# Patient Record
Sex: Female | Born: 1970 | Race: Black or African American | Hispanic: No | Marital: Married | Smoking: Never smoker
Health system: Southern US, Community
[De-identification: ages and names within clinical notes are randomized; demographics above are authoritative.]

## PROBLEM LIST (undated history)

## (undated) DIAGNOSIS — N920 Excessive and frequent menstruation with regular cycle: Secondary | ICD-10-CM

## (undated) DIAGNOSIS — R51 Headache: Secondary | ICD-10-CM

## (undated) DIAGNOSIS — R011 Cardiac murmur, unspecified: Secondary | ICD-10-CM

## (undated) DIAGNOSIS — K219 Gastro-esophageal reflux disease without esophagitis: Secondary | ICD-10-CM

## (undated) DIAGNOSIS — J302 Other seasonal allergic rhinitis: Secondary | ICD-10-CM

## (undated) DIAGNOSIS — Z8669 Personal history of other diseases of the nervous system and sense organs: Secondary | ICD-10-CM

## (undated) DIAGNOSIS — F419 Anxiety disorder, unspecified: Secondary | ICD-10-CM

## (undated) DIAGNOSIS — D649 Anemia, unspecified: Secondary | ICD-10-CM

## (undated) DIAGNOSIS — N840 Polyp of corpus uteri: Secondary | ICD-10-CM

## (undated) DIAGNOSIS — J40 Bronchitis, not specified as acute or chronic: Secondary | ICD-10-CM

## (undated) DIAGNOSIS — E559 Vitamin D deficiency, unspecified: Secondary | ICD-10-CM

## (undated) DIAGNOSIS — J45909 Unspecified asthma, uncomplicated: Secondary | ICD-10-CM

## (undated) DIAGNOSIS — J189 Pneumonia, unspecified organism: Secondary | ICD-10-CM

## (undated) DIAGNOSIS — Z973 Presence of spectacles and contact lenses: Secondary | ICD-10-CM

## (undated) DIAGNOSIS — N879 Dysplasia of cervix uteri, unspecified: Secondary | ICD-10-CM

## (undated) DIAGNOSIS — T4145XA Adverse effect of unspecified anesthetic, initial encounter: Secondary | ICD-10-CM

## (undated) DIAGNOSIS — E0500 Thyrotoxicosis with diffuse goiter without thyrotoxic crisis or storm: Secondary | ICD-10-CM

## (undated) DIAGNOSIS — E785 Hyperlipidemia, unspecified: Secondary | ICD-10-CM

## (undated) DIAGNOSIS — T8859XA Other complications of anesthesia, initial encounter: Secondary | ICD-10-CM

## (undated) DIAGNOSIS — T7840XA Allergy, unspecified, initial encounter: Secondary | ICD-10-CM

## (undated) DIAGNOSIS — I10 Essential (primary) hypertension: Secondary | ICD-10-CM

## (undated) HISTORY — PX: BREAST SURGERY: SHX581

## (undated) HISTORY — DX: Anxiety disorder, unspecified: F41.9

## (undated) HISTORY — DX: Personal history of other diseases of the nervous system and sense organs: Z86.69

## (undated) HISTORY — DX: Thyrotoxicosis with diffuse goiter without thyrotoxic crisis or storm: E05.00

## (undated) HISTORY — DX: Dysplasia of cervix uteri, unspecified: N87.9

## (undated) HISTORY — PX: WISDOM TOOTH EXTRACTION: SHX21

## (undated) HISTORY — DX: Essential (primary) hypertension: I10

## (undated) HISTORY — DX: Allergy, unspecified, initial encounter: T78.40XA

## (undated) HISTORY — DX: Gastro-esophageal reflux disease without esophagitis: K21.9

---

## 1997-10-19 ENCOUNTER — Other Ambulatory Visit: Admission: RE | Admit: 1997-10-19 | Discharge: 1997-10-19 | Payer: Self-pay | Admitting: Obstetrics & Gynecology

## 1997-12-14 ENCOUNTER — Ambulatory Visit (HOSPITAL_COMMUNITY): Admission: RE | Admit: 1997-12-14 | Discharge: 1997-12-14 | Payer: Self-pay | Admitting: Obstetrics & Gynecology

## 1998-04-28 ENCOUNTER — Other Ambulatory Visit: Admission: RE | Admit: 1998-04-28 | Discharge: 1998-04-28 | Payer: Self-pay | Admitting: Obstetrics & Gynecology

## 1999-03-04 ENCOUNTER — Ambulatory Visit (HOSPITAL_COMMUNITY): Admission: RE | Admit: 1999-03-04 | Discharge: 1999-03-04 | Payer: Self-pay | Admitting: Obstetrics & Gynecology

## 1999-03-04 ENCOUNTER — Encounter: Payer: Self-pay | Admitting: Obstetrics & Gynecology

## 1999-03-04 ENCOUNTER — Encounter (INDEPENDENT_AMBULATORY_CARE_PROVIDER_SITE_OTHER): Payer: Self-pay | Admitting: Specialist

## 1999-04-12 ENCOUNTER — Encounter (INDEPENDENT_AMBULATORY_CARE_PROVIDER_SITE_OTHER): Payer: Self-pay | Admitting: *Deleted

## 1999-04-12 ENCOUNTER — Ambulatory Visit (HOSPITAL_COMMUNITY): Admission: RE | Admit: 1999-04-12 | Discharge: 1999-04-12 | Payer: Self-pay | Admitting: Obstetrics & Gynecology

## 2000-02-29 ENCOUNTER — Encounter: Payer: Self-pay | Admitting: *Deleted

## 2000-02-29 ENCOUNTER — Observation Stay (HOSPITAL_COMMUNITY): Admission: AD | Admit: 2000-02-29 | Discharge: 2000-02-29 | Payer: Self-pay | Admitting: *Deleted

## 2000-09-17 ENCOUNTER — Inpatient Hospital Stay (HOSPITAL_COMMUNITY): Admission: AD | Admit: 2000-09-17 | Discharge: 2000-09-19 | Payer: Self-pay | Admitting: *Deleted

## 2000-12-25 ENCOUNTER — Emergency Department (HOSPITAL_COMMUNITY): Admission: EM | Admit: 2000-12-25 | Discharge: 2000-12-25 | Payer: Self-pay | Admitting: Emergency Medicine

## 2003-01-08 ENCOUNTER — Other Ambulatory Visit: Admission: RE | Admit: 2003-01-08 | Discharge: 2003-01-08 | Payer: Self-pay | Admitting: Gynecology

## 2003-03-03 ENCOUNTER — Other Ambulatory Visit: Admission: RE | Admit: 2003-03-03 | Discharge: 2003-03-03 | Payer: Self-pay | Admitting: Gynecology

## 2003-04-27 ENCOUNTER — Encounter: Admission: RE | Admit: 2003-04-27 | Discharge: 2003-04-27 | Payer: Self-pay | Admitting: Allergy and Immunology

## 2003-05-04 ENCOUNTER — Encounter: Admission: RE | Admit: 2003-05-04 | Discharge: 2003-08-02 | Payer: Self-pay | Admitting: Internal Medicine

## 2003-12-23 ENCOUNTER — Other Ambulatory Visit: Admission: RE | Admit: 2003-12-23 | Discharge: 2003-12-23 | Payer: Self-pay | Admitting: Gynecology

## 2004-02-01 HISTORY — PX: OTHER SURGICAL HISTORY: SHX169

## 2004-02-19 ENCOUNTER — Encounter (INDEPENDENT_AMBULATORY_CARE_PROVIDER_SITE_OTHER): Payer: Self-pay | Admitting: *Deleted

## 2004-02-19 ENCOUNTER — Ambulatory Visit (HOSPITAL_COMMUNITY): Admission: RE | Admit: 2004-02-19 | Discharge: 2004-02-19 | Payer: Self-pay | Admitting: Gynecology

## 2005-03-07 ENCOUNTER — Other Ambulatory Visit: Admission: RE | Admit: 2005-03-07 | Discharge: 2005-03-07 | Payer: Self-pay | Admitting: Gynecology

## 2005-11-12 ENCOUNTER — Inpatient Hospital Stay (HOSPITAL_COMMUNITY): Admission: AD | Admit: 2005-11-12 | Discharge: 2005-11-14 | Payer: Self-pay | Admitting: Gynecology

## 2005-12-25 ENCOUNTER — Other Ambulatory Visit: Admission: RE | Admit: 2005-12-25 | Discharge: 2005-12-25 | Payer: Self-pay | Admitting: Gynecology

## 2006-01-30 ENCOUNTER — Encounter: Admission: RE | Admit: 2006-01-30 | Discharge: 2006-03-01 | Payer: Self-pay | Admitting: Gynecology

## 2006-03-02 ENCOUNTER — Encounter: Admission: RE | Admit: 2006-03-02 | Discharge: 2006-04-01 | Payer: Self-pay | Admitting: Gynecology

## 2006-04-02 ENCOUNTER — Encounter: Admission: RE | Admit: 2006-04-02 | Discharge: 2006-05-02 | Payer: Self-pay | Admitting: Gynecology

## 2006-05-03 ENCOUNTER — Encounter: Admission: RE | Admit: 2006-05-03 | Discharge: 2006-06-01 | Payer: Self-pay | Admitting: Gynecology

## 2006-06-02 ENCOUNTER — Encounter: Admission: RE | Admit: 2006-06-02 | Discharge: 2006-07-02 | Payer: Self-pay | Admitting: Gynecology

## 2006-07-03 ENCOUNTER — Encounter: Admission: RE | Admit: 2006-07-03 | Discharge: 2006-07-07 | Payer: Self-pay | Admitting: Gynecology

## 2006-11-28 ENCOUNTER — Emergency Department (HOSPITAL_COMMUNITY): Admission: EM | Admit: 2006-11-28 | Discharge: 2006-11-29 | Payer: Self-pay | Admitting: Emergency Medicine

## 2007-02-04 ENCOUNTER — Other Ambulatory Visit: Admission: RE | Admit: 2007-02-04 | Discharge: 2007-02-04 | Payer: Self-pay | Admitting: Gynecology

## 2007-08-14 ENCOUNTER — Encounter: Admission: RE | Admit: 2007-08-14 | Discharge: 2007-08-14 | Payer: Self-pay | Admitting: Allergy and Immunology

## 2008-02-19 ENCOUNTER — Other Ambulatory Visit: Admission: RE | Admit: 2008-02-19 | Discharge: 2008-02-19 | Payer: Self-pay | Admitting: Gynecology

## 2008-07-07 ENCOUNTER — Encounter: Admission: RE | Admit: 2008-07-07 | Discharge: 2008-07-07 | Payer: Self-pay | Admitting: Allergy and Immunology

## 2008-07-08 ENCOUNTER — Emergency Department (HOSPITAL_COMMUNITY): Admission: EM | Admit: 2008-07-08 | Discharge: 2008-07-09 | Payer: Self-pay | Admitting: Emergency Medicine

## 2008-08-18 ENCOUNTER — Ambulatory Visit: Payer: Self-pay | Admitting: Gynecology

## 2008-11-08 ENCOUNTER — Emergency Department (HOSPITAL_COMMUNITY): Admission: EM | Admit: 2008-11-08 | Discharge: 2008-11-08 | Payer: Self-pay | Admitting: Emergency Medicine

## 2008-11-27 ENCOUNTER — Emergency Department (HOSPITAL_COMMUNITY): Admission: EM | Admit: 2008-11-27 | Discharge: 2008-11-27 | Payer: Self-pay | Admitting: Emergency Medicine

## 2009-02-05 ENCOUNTER — Emergency Department (HOSPITAL_BASED_OUTPATIENT_CLINIC_OR_DEPARTMENT_OTHER): Admission: EM | Admit: 2009-02-05 | Discharge: 2009-02-05 | Payer: Self-pay | Admitting: Emergency Medicine

## 2009-02-19 ENCOUNTER — Ambulatory Visit: Payer: Self-pay | Admitting: Gynecology

## 2009-02-19 ENCOUNTER — Other Ambulatory Visit: Admission: RE | Admit: 2009-02-19 | Discharge: 2009-02-19 | Payer: Self-pay | Admitting: Gynecology

## 2009-02-19 ENCOUNTER — Encounter: Payer: Self-pay | Admitting: Gynecology

## 2009-05-21 ENCOUNTER — Ambulatory Visit: Payer: Self-pay | Admitting: Gynecology

## 2009-06-14 ENCOUNTER — Encounter: Admission: RE | Admit: 2009-06-14 | Discharge: 2009-06-14 | Payer: Self-pay | Admitting: Family Medicine

## 2009-12-16 ENCOUNTER — Emergency Department (HOSPITAL_COMMUNITY): Admission: EM | Admit: 2009-12-16 | Discharge: 2009-12-16 | Payer: Self-pay | Admitting: Emergency Medicine

## 2010-02-24 ENCOUNTER — Other Ambulatory Visit: Admission: RE | Admit: 2010-02-24 | Discharge: 2010-02-24 | Payer: Self-pay | Admitting: Gynecology

## 2010-02-24 ENCOUNTER — Ambulatory Visit: Payer: Self-pay | Admitting: Gynecology

## 2010-07-24 ENCOUNTER — Encounter: Payer: Self-pay | Admitting: Psychiatry

## 2010-10-09 LAB — DIFFERENTIAL
Basophils Relative: 2 % — ABNORMAL HIGH (ref 0–1)
Eosinophils Absolute: 0 10*3/uL (ref 0.0–0.7)
Lymphs Abs: 1.1 10*3/uL (ref 0.7–4.0)
Monocytes Relative: 6 % (ref 3–12)
Neutro Abs: 4.8 10*3/uL (ref 1.7–7.7)
Neutrophils Relative %: 74 % (ref 43–77)

## 2010-10-09 LAB — URINALYSIS, ROUTINE W REFLEX MICROSCOPIC
Glucose, UA: NEGATIVE mg/dL
Ketones, ur: 40 mg/dL — AB
Leukocytes, UA: NEGATIVE
Nitrite: NEGATIVE
Protein, ur: NEGATIVE mg/dL

## 2010-10-09 LAB — CBC
HCT: 38.7 % (ref 36.0–46.0)
Hemoglobin: 13.1 g/dL (ref 12.0–15.0)
MCHC: 34 g/dL (ref 30.0–36.0)
RBC: 4.12 MIL/uL (ref 3.87–5.11)
RDW: 12.6 % (ref 11.5–15.5)

## 2010-10-09 LAB — COMPREHENSIVE METABOLIC PANEL
ALT: 12 U/L (ref 0–35)
Alkaline Phosphatase: 54 U/L (ref 39–117)
BUN: 8 mg/dL (ref 6–23)
CO2: 27 mEq/L (ref 19–32)
Calcium: 9.4 mg/dL (ref 8.4–10.5)
GFR calc non Af Amer: >60 mL/min (ref >60)
Glucose, Bld: 84 mg/dL (ref 70–99)
Total Protein: 8.3 g/dL (ref 6.0–8.3)

## 2010-10-09 LAB — POCT CARDIAC MARKERS
CKMB, poc: <1 ng/mL — ABNORMAL LOW (ref 1.0–8.0)
Myoglobin, poc: 47.9 ng/mL (ref 12–200)
Troponin i, poc: <0.05 ng/mL (ref 0.00–0.09)

## 2010-10-09 LAB — URINE MICROSCOPIC-ADD ON

## 2010-10-09 LAB — PREGNANCY, URINE: Preg Test, Ur: NEGATIVE

## 2010-10-17 LAB — POCT I-STAT, CHEM 8
Chloride: 101 mEq/L (ref 96–112)
HCT: 42 % (ref 36.0–46.0)
Hemoglobin: 14.3 g/dL (ref 12.0–15.0)
Potassium: 3.5 mEq/L (ref 3.5–5.1)
Sodium: 140 mEq/L (ref 135–145)

## 2010-10-17 LAB — URINALYSIS, ROUTINE W REFLEX MICROSCOPIC
Bilirubin Urine: NEGATIVE
Nitrite: NEGATIVE
Specific Gravity, Urine: 1.024 (ref 1.005–1.030)
Urobilinogen, UA: 0.2 mg/dL (ref 0.0–1.0)
pH: 6.5 (ref 5.0–8.0)

## 2010-11-18 NOTE — Op Note (Signed)
NAME:  Marilyn Gibson, Marilyn Gibson                         ACCOUNT NO.:  [id number]   MEDICAL RECORD NO.:  [id number]                   PATIENT TYPE:  AMB   LOCATION:  SDC                                  FACILITY:  [facility]   PHYSICIAN:  Timothy P. Fontaine, M.D.           DATE OF BIRTH:  [birth year]   DATE OF PROCEDURE:  02/19/2004  DATE OF DISCHARGE:                                 OPERATIVE REPORT   PREOPERATIVE DIAGNOSES:  Intrauterine fetal demise 19 weeks.   POSTOPERATIVE DIAGNOSES:  Intrauterine fetal demise 19 weeks.   PROCEDURE:  Dilatation and evacuation.   SURGEON:  Timothy P. Fontaine, M.D.   ANESTHESIA:  General.   ESTIMATED BLOOD LOSS:  50-100 mL.   SPECIMENS:  Fetus and placenta, specimen for chromosomal studies.   COMPLICATIONS:  None.   FINDINGS:  Sixteen week size uterus, fetus removed fragmented,  reconstruction complete.  No gross abnormalities noted.   DESCRIPTION OF PROCEDURE:  The patient was taken to the operating room,  underwent general anesthesia and was placed in the dorsal lithotomy  position, received a perineal vaginal preparation with Betadine solution.  The bladder emptied with in and out Foley catheterization and EUA was  performed. The patient was draped in the usual fashion. Cervix visualized  with the speculum, anterior lip grasped with a single tooth tenaculum and  the cervix was gently gradually dilated to admit the #16 suction curette.  The suction curettage was then performed with initial removal of placental  fragments and amniotic fluid. Through repetitive forceps passes using the  extraction forceps, the fetus and remaining placenta were removed and the  fetus was reconstructed to assure complete removal. A sharp gentle curettage  was then performed to assure complete emptying. A final suction pass to  evacuate the uterus from blood and clots, the tenaculum removed and adequate  hemostasis was visualized. The patient did receive 0.2 mg Methergine  IM at  the end of the procedure to maintain hemostasis.  The patient was placed in  supine position, awakened without difficulty, taken to the recovery room in  good condition having tolerated the procedure well.  The patient's blood  type is A positive.                                               Timothy P. Audie Box, M.D.    TPF/MEDQ  D:  02/19/2004  T:  02/20/2004  Job:  [id number]

## 2010-11-18 NOTE — Discharge Summary (Signed)
[facility]  Patient:    Marilyn Gibson, Marilyn Gibson                      MRN: [id number] Adm. Date:  [id number] Disc. Date: [id number] Attending:  Wetzel Bjornstad Dictator:   Antony Contras,  [facility]                           Discharge Summary  DISCHARGE DIAGNOSES:          Intrauterine pregnancy at 9 weeks with hyperemesis.  HISTORY OF PRESENT ILLNESS:   The patient is a 40 year old gravida 4, para 0, who is currently [redacted] weeks pregnant and presents to [facility] with a history of nausea and vomiting.  HOSPITAL COURSE/TREATMENT:    The patient was admitted to third floor at [facility] for IV fluids.  She also had an ultrasound to rule out a molar pregnancy.  She was hydrated and did respond to this measure and was able to be discharged on her first postadmission day in satisfactory condition.  DISPOSITION:                  The patient is to follow up for ultrasound and also for prenatal care. DD:  04/20/00 TD:  04/20/00 Job: [id number]

## 2010-11-18 NOTE — Discharge Summary (Signed)
[facility]  Patient:    Marilyn Gibson, Marilyn Gibson                      MRN: [id number] Adm. Date:  [id number] Disc. Date: [id number] Attending:  Wetzel Bjornstad Dictator:   Antony Contras, [facility]                           Discharge Summary  DISCHARGE DIAGNOSES:          1. Intrauterine pregnancy at 37-5/7 weeks.                               2. History of hyperthyroidism.  PROCEDURES:                   1. Normal spontaneous vaginal delivery of viable                                  infant over intact perineum.                               2. Repair of first-degree laceration.  HISTORY OF PRESENT ILLNESS:   The patient is a 40 year old gravida 4, para 0-0-3-0, with LMP of December 26, 1999, [facility] of October 01, 2000.  Pregnancy was complicated by hyperthyroidism.  PRENATAL LABORATORY DATA:     Blood type A positive, antibody screen negative. Sickle cell trait negative.  RPR, HBsAg, HIV nonreactive.  Rubella titer was equivocal.  GBS not on chart.  HOSPITAL COURSE:              The patient was admitted on September 17, 2000, at 37 weeks 5 days with regular contractions and was found to be 3-4 cm dilated, 100% effaced.  She did progress to complete dilatation.  Was delivered of an Apgars 9 and 4 female infant weighing 6 pounds 4 ounces over an intact perineum, with repair of first-degree laceration.  She also sustained a periurethral tear.  Postpartum course was normal.  She remained afebrile, had no difficulty voiding, and was able to be discharged on her second postpartum day in satisfactory condition.  CBC:  Hematocrit 28, hemoglobin 10.0, WBC 19.3, platelets 203.  FOLLOW-UP:                    In six weeks.  MEDICATIONS:                  Continue with prenatal vitamins and iron. Motrin for pain.  Rubella vaccine prior to discharge. DD:  10/08/00 TD:  10/09/00 Job: [id number] MV/[id number]

## 2010-11-18 NOTE — H&P (Signed)
NAME:  Marilyn Gibson, Marilyn Gibson                         ACCOUNT NO.:  [id number]   MEDICAL RECORD NO.:  [id number]                   PATIENT TYPE:  AMB   LOCATION:  SDC                                  [facility]ACILITY:  [facility]   PHYSICIAN:  Timothy P. Fontaine, M.D.           DATE OF BIRTH:  27-Aug-1970   DATE OF ADMISSION:  02/19/2004  DATE OF DISCHARGE:                                HISTORY & PHYSICAL   CHIEF COMPLAINT:  Missed AB.   HISTORY OF PRESENT ILLNESS:  A 40 year old G5 P25 female at 65 weeks by  history presents to the office for a routine OB with ultrasound screening  and was found to have a fetal demise with a measured fetus at approximately  16 weeks.  There is no cardiac activity and evidence of hydrops with scalp  edema, pericardial effusion, and ascites.  The patient is admitted at this  time for D&E.   PAST MEDICAL HISTORY:  Asthma, hypothyroidism.   PAST SURGICAL HISTORY:  Includes D&C x3, LEEP biopsy of the cervix.   ALLERGIES:  PENICILLIN.   CURRENT MEDICATIONS:  None.   REVIEW OF SYSTEMS:  Noncontributory.   SOCIAL HISTORY:  Noncontributory.   FAMILY HISTORY:  Noncontributory.   ADMISSION PHYSICAL EXAMINATION:  VITAL SIGNS:  Afebrile, vital signs are  stable.  HEENT:  Normal.  LUNGS:  Clear.  CARDIAC:  Regular rate without rubs, murmurs, or gallops.  ABDOMEN:  Benign.  PELVIC:  External, BUS, vagina normal.  Cervix is normal and closed.  Uterus  approximately 16 weeks size, midline, mobile, soft.  Adnexa without gross  masses or tenderness.   ASSESSMENT:  Fetal demise, 19 weeks by dates, 16 weeks by size; for  dilatation and evacuation.  Risks, benefits, indications, and alternatives  for the procedure were reviewed with the patient to include the options for  expectant management, induction, versus D&E.  The risks of D&E were reviewed  to include infection; hemorrhage necessitating transfusion and the risks of  transfusion; uterine perforation causing internal  organ damage including  bowel, bladder, ureters, vessels and nerves necessitating major exploratory  reparative surgeries and further  reparative surgeries including ostomy formation.  The patient had a number  of labs drawn through the office due to the fetal loss and these results are  pending.  Her questions are answered to her satisfaction and she is ready to  proceed with surgery.  Her blood type is A positive.                                               Timothy P. Audie Box, M.D.    TPF/MEDQ  D:  02/19/2004  T:  02/19/2004  Job:  [id number]

## 2011-03-24 ENCOUNTER — Encounter: Payer: Self-pay | Admitting: Gynecology

## 2011-04-14 ENCOUNTER — Ambulatory Visit (INDEPENDENT_AMBULATORY_CARE_PROVIDER_SITE_OTHER): Payer: 59 | Admitting: Gynecology

## 2011-04-14 ENCOUNTER — Other Ambulatory Visit (HOSPITAL_COMMUNITY)
Admission: RE | Admit: 2011-04-14 | Discharge: 2011-04-14 | Disposition: A | Discharge status: Home / Self Care | Payer: 59 | Source: Ambulatory Visit | Attending: Gynecology | Admitting: Gynecology

## 2011-04-14 ENCOUNTER — Encounter: Payer: Self-pay | Admitting: Gynecology

## 2011-04-14 VITALS — BP 120/80 | Ht 62.75 in | Wt 167.0 lb

## 2011-04-14 DIAGNOSIS — Z01419 Encounter for gynecological examination (general) (routine) without abnormal findings: Secondary | ICD-10-CM | POA: Insufficient documentation

## 2011-04-14 DIAGNOSIS — E559 Vitamin D deficiency, unspecified: Secondary | ICD-10-CM

## 2011-04-14 DIAGNOSIS — N926 Irregular menstruation, unspecified: Secondary | ICD-10-CM

## 2011-04-14 DIAGNOSIS — K219 Gastro-esophageal reflux disease without esophagitis: Secondary | ICD-10-CM | POA: Insufficient documentation

## 2011-04-14 NOTE — Progress Notes (Signed)
Marilyn Gibson August 03, 1970 [id number]        40 y.o.  for annual exam.  Overall doing well. Notes that her menses have changed a little bit over the last several months where she will start for a day or 2 and spot per day and and finish for a day or 2. No intermenstrual bleeding and they occur regularly each month.   Past medical history,surgical history, medications, allergies, family history and social history were all reviewed and documented in the EPIC chart. ROS:  Was performed and pertinent positives and negatives are included in the history.  Exam: chaperone present Filed Vitals:   04/14/11 1552  BP: 120/80   General appearance  Normal Skin grossly normal Head/Neck normal with no cervical or supraclavicular adenopathy thyroid normal Lungs  clear Cardiac RR, without RMG Abdominal  soft, nontender, without masses, organomegaly or hernia Breasts  examined lying and sitting without masses, retractions, discharge or axillary adenopathy. Pelvic  Ext/BUS/vagina  normal   Cervix  normal  Pap done  Uterus  anteverted, normal size, shape and contour, midline and mobile nontender   Adnexa  Without masses or tenderness    Anus and perineum  normal   Rectovaginal  normal sphincter tone without palpated masses or tenderness.    Assessment/Plan:  40 y.o. female for annual exam.    1. Mild menstrual irregularity. We'll check baseline TSH she does have a history of Graves' disease. Assuming normal then will keep menstrual calendar present. She's not having heavier menses, intermenstrual bleeding or even longer cycles to suggest structural changes such as polyps or myomas. Assuming her periods remain regular then we'll follow if they become prolonged or heavier or intermenstrual spotting than she'll call and we'll plan sonohysterogram. 2. Health maintenance. Self breast exams on a monthly basis discussed encouraged. She's never had mammogram and recommend she go ahead and schedule it now she agrees  to arrange this. No other blood work was done today as she reports having this done at her primary to include CBC lipid profile this year. Using spermicide for contraception as we have discussed on several occasions the failure risk with this and alternatives and she is comfortable continuing this excepting the failure risk.    Dara Lords MD, 4:51 PM 04/14/2011

## 2012-05-07 ENCOUNTER — Encounter: Payer: 59 | Admitting: Gynecology

## 2012-05-14 ENCOUNTER — Encounter: Payer: 59 | Admitting: Gynecology

## 2012-05-23 ENCOUNTER — Ambulatory Visit (INDEPENDENT_AMBULATORY_CARE_PROVIDER_SITE_OTHER): Payer: 59 | Admitting: Gynecology

## 2012-05-23 ENCOUNTER — Encounter: Payer: Self-pay | Admitting: Gynecology

## 2012-05-23 VITALS — BP 122/78 | Ht 63.0 in | Wt 184.0 lb

## 2012-05-23 DIAGNOSIS — Z01419 Encounter for gynecological examination (general) (routine) without abnormal findings: Secondary | ICD-10-CM

## 2012-05-23 DIAGNOSIS — Z8669 Personal history of other diseases of the nervous system and sense organs: Secondary | ICD-10-CM | POA: Insufficient documentation

## 2012-05-23 DIAGNOSIS — Z131 Encounter for screening for diabetes mellitus: Secondary | ICD-10-CM

## 2012-05-23 DIAGNOSIS — N879 Dysplasia of cervix uteri, unspecified: Secondary | ICD-10-CM | POA: Insufficient documentation

## 2012-05-23 DIAGNOSIS — I10 Essential (primary) hypertension: Secondary | ICD-10-CM | POA: Insufficient documentation

## 2012-05-23 DIAGNOSIS — N898 Other specified noninflammatory disorders of vagina: Secondary | ICD-10-CM

## 2012-05-23 DIAGNOSIS — Z309 Encounter for contraceptive management, unspecified: Secondary | ICD-10-CM

## 2012-05-23 DIAGNOSIS — E559 Vitamin D deficiency, unspecified: Secondary | ICD-10-CM

## 2012-05-23 DIAGNOSIS — N946 Dysmenorrhea, unspecified: Secondary | ICD-10-CM

## 2012-05-23 DIAGNOSIS — N899 Noninflammatory disorder of vagina, unspecified: Secondary | ICD-10-CM

## 2012-05-23 LAB — CBC WITH DIFFERENTIAL/PLATELET
Eosinophils Absolute: 0.2 10*3/uL (ref 0.0–0.7)
Eosinophils Relative: 3 % (ref 0–5)
Hemoglobin: 11.8 g/dL — ABNORMAL LOW (ref 12.0–15.0)
Lymphs Abs: 2.3 10*3/uL (ref 0.7–4.0)
MCH: 28.4 pg (ref 26.0–34.0)
MCHC: 33.9 g/dL (ref 30.0–36.0)
MCV: 83.9 fL (ref 78.0–100.0)
Monocytes Relative: 12 % (ref 3–12)
RBC: 4.15 MIL/uL (ref 3.87–5.11)

## 2012-05-23 LAB — WET PREP FOR TRICH, YEAST, CLUE: Trich, Wet Prep: NONE SEEN

## 2012-05-23 MED ORDER — FLUCONAZOLE 150 MG PO TABS: 150.0000 mg | ORAL_TABLET | Freq: Once | ORAL | Status: DC

## 2012-05-23 NOTE — Progress Notes (Signed)
Marilyn Gibson 05-15-1971 [id number]        41 y.o.  [id number] for annual exam.    Past medical history,surgical history, medications, allergies, family history and social history were all reviewed and documented in the EPIC chart. ROS:  Was performed and pertinent positives and negatives are included in the history.  Exam: Kim assistant Filed Vitals:   05/23/12 1507  BP: 122/78  Height: 5\' 3"  (1.6 m)  Weight: 184 lb (83.462 kg)   General appearance  Normal Skin grossly normal Head/Neck normal with no cervical or supraclavicular adenopathy thyroid normal Lungs  clear Cardiac RR, without RMG Abdominal  soft, nontender, without masses, organomegaly or hernia Breasts  examined lying and sitting without masses, retractions, discharge or axillary adenopathy. Pelvic  Ext/BUS/vagina  normal with white discharge  Cervix  normal   Uterus  anteverted, normal size, shape and contour, midline and mobile nontender   Adnexa  Without masses or tenderness    Anus and perineum  normal   Rectovaginal  normal sphincter tone without palpated masses or tenderness.    Assessment/Plan:  41 y.o. [id number] female for annual exam, regular menses, spermicide contraception..   1. Contraceptive counseling. Patient using spermicide to want to talk about tubal sterilization. Has issues with hormonal contraception with headaches to include a Mirena IUD having to be removed in birth control pills. Husband refuses vasectomy. I reviewed with the involved with tubal sterilization to include the most recent recommendations by SGO to consider salpingectomies for risk reduction surgery for serous carcinoma even in a low risk patient. She is complaining of some dysmenorrhea which seems to be worsening for the first several days of her menses over the past 6 months and I recommended ultrasound to assess for myomas/adenomyosis before scheduling surgery. If evidence of adenomyosis or myomas options for hysterectomy will be  discussed. 2. Pap smear. No Pap smear done today. Last Pap smear 04/2011. Does have history of dysplasia as a teenager but none since with normal Pap smears. Plan every 3 year screening. 3. Mammography. Patient's mammogram I strongly recommend she schedule this and she agrees to do so. SBE monthly reviewed. 4. Vaginal itching. Slight discharge historically. Wet prep/exam consistent with yeast. Treat with Diflucan 150x1 dose. Follow up if symptoms persist or recur. 5. Health maintenance. History of low vitamin D earlier this year at check by her primary. We'll recheck vitamin D now she's been on a supplement. Has had lipid profile at her primary. Asked for glucose screening US apparently she had a elevated glucose and we'll go ahead with a hemoglobin A1c. We'll also check baseline CBC. Patient will follow up for ultrasound and surgical discussion. Mammogram again encouraged.    Dara Lords MD, 4:09 PM 05/23/2012

## 2012-05-23 NOTE — Patient Instructions (Addendum)
Take diflucan pill for yeast Follow up for ultrasound as scheduled  Call to Schedule your mammogram  Facilities in [place]: 1)  [facility] of [place], [place] Phone: [phone] 2)  [facility], [phone] Phone: [phone] 3)  Dr. Yolanda Bonine at [facility] [address] Phone: [phone]     Mammogram A mammogram is an X-ray test to find changes in a woman's breast. You should get a mammogram if:  You are 64 years of age or older  You have risk factors.   Your doctor recommends that you have one.  BEFORE THE TEST  Do not schedule the test the week before your period, especially if your breasts are sore during this time.  On the day of your mammogram:  Wash your breasts and armpits well. After washing, do not put on any deodorant or talcum powder on until after your test.   Eat and drink as you usually do.   Take your medicines as usual.   If you are diabetic and take insulin, make sure you:   Eat before coming for your test.   Take your insulin as usual.   If you cannot keep your appointment, call before the appointment to cancel. Schedule another appointment.  TEST  You will need to undress from the waist up. You will put on a hospital gown.   Your breast will be put on the mammogram machine, and it will press firmly on your breast with a piece of plastic called a compression paddle. This will make your breast flatter so that the machine can X-ray all parts of your breast.   Both breasts will be X-rayed. Each breast will be X-rayed from above and from the side. An X-ray might need to be taken again if the picture is not good enough.   The mammogram will last about 15 to 30 minutes.  AFTER THE TEST Finding out the results of your test Ask when your test results will be ready. Make sure you get your test results.  Document Released: 09/15/2008 Document  Revised: 06/08/2011 Document Reviewed: 09/15/2008 [facility] Patient Information 2012 [place], [place].

## 2012-05-24 ENCOUNTER — Encounter: Payer: Self-pay | Admitting: Gynecology

## 2012-05-24 LAB — URINALYSIS W MICROSCOPIC + REFLEX CULTURE
Glucose, UA: NEGATIVE mg/dL
Hgb urine dipstick: NEGATIVE
Leukocytes, UA: NEGATIVE
Protein, ur: NEGATIVE mg/dL
Urobilinogen, UA: 0.2 mg/dL (ref 0.0–1.0)

## 2012-05-24 LAB — VITAMIN D 25 HYDROXY (VIT D DEFICIENCY, FRACTURES): Vit D, 25-Hydroxy: 35 ng/mL (ref 30–89)

## 2012-06-07 ENCOUNTER — Ambulatory Visit (INDEPENDENT_AMBULATORY_CARE_PROVIDER_SITE_OTHER): Payer: 59

## 2012-06-07 ENCOUNTER — Ambulatory Visit (INDEPENDENT_AMBULATORY_CARE_PROVIDER_SITE_OTHER): Payer: 59 | Admitting: Gynecology

## 2012-06-07 ENCOUNTER — Encounter: Payer: Self-pay | Admitting: Gynecology

## 2012-06-07 DIAGNOSIS — N946 Dysmenorrhea, unspecified: Secondary | ICD-10-CM

## 2012-06-07 NOTE — Patient Instructions (Signed)
Office will call you to arrange surgery. 

## 2012-06-07 NOTE — Progress Notes (Signed)
Patient presents for ultrasound due to dysmenorrhea. She is planning on tubal sterilization and we wanted to make sure there is no overt evidence of endometriosis or adenomyosis that might warrant a more aggressive approach such as hysterectomy.  Ultrasound is normal noting normal uterine size and echotexture. Endometrial echo 4.5 mm.  Right and left ovaries normal. No cul-de-sac fluid.  Assessment and plan: Patient wants to proceed with tubal sterilization. I again reviewed more conservative options she has issues with headaches when using hormonal contraception to include pills as well as progesterone only as well as her Mirena IUD. Nonhormonal IUD and vasectomy discussed and rejected. I reviewed was involved with laparoscopy and tubal sterilization. I again discussed the SGO statement about salpingectomies as a risk reductive surgery for peritoneal cancer and options for salpingectomy versus bending or clipping discussed. Patient agrees and wants to proceed with salpingectomies. Absolute irreversible sterility with bilateral salpingectomies discussed as well as the potential for failure and pregnancy following sterilization. The expected intraoperative postoperative courses to include general anesthesia, trocar placement, multiple port sites, use of electrocautery harmonic scalpel sharp blunt dissection reviewed. The risks of infection, prolonged antibiotics, hemorrhage necessitating transfusion and the risks of transfusion including transfusion reaction hepatitis HIV mad cow disease and other unknown entities were reviewed. Social complications requiring opening and draining of incisions, closure by secondary intention and long-term issues of cosmetics/keloid and hernia formation reviewed. The risks of inadvertent injury to internal organs either immediately recognized or delay recognized including bowel bladder ureters vessels and nerves necessitating major exploratory reparative surgeries and future  reparative surgeries including ostomy formation bowel resection bladder repair ureteral damage repair all discussed understood and accepted. The patient's questions were answered to her satisfaction and she is ready to proceed with surgery and we'll schedule this at her convenience.

## 2012-06-23 ENCOUNTER — Encounter (HOSPITAL_COMMUNITY): Payer: Self-pay | Admitting: Pharmacist

## 2012-07-02 ENCOUNTER — Encounter (HOSPITAL_COMMUNITY): Payer: Self-pay

## 2012-07-02 ENCOUNTER — Other Ambulatory Visit: Payer: Self-pay

## 2012-07-02 ENCOUNTER — Encounter (HOSPITAL_COMMUNITY)
Admission: RE | Admit: 2012-07-02 | Discharge: 2012-07-02 | Disposition: A | Discharge status: Home / Self Care | Payer: 59 | Source: Ambulatory Visit | Attending: Gynecology | Admitting: Gynecology

## 2012-07-02 DIAGNOSIS — Z01818 Encounter for other preprocedural examination: Secondary | ICD-10-CM | POA: Insufficient documentation

## 2012-07-02 DIAGNOSIS — Z01812 Encounter for preprocedural laboratory examination: Secondary | ICD-10-CM | POA: Insufficient documentation

## 2012-07-02 HISTORY — DX: Unspecified asthma, uncomplicated: J45.909

## 2012-07-02 HISTORY — DX: Headache: R51

## 2012-07-02 HISTORY — DX: Bronchitis, not specified as acute or chronic: J40

## 2012-07-02 HISTORY — DX: Other seasonal allergic rhinitis: J30.2

## 2012-07-02 HISTORY — DX: Anemia, unspecified: D64.9

## 2012-07-02 HISTORY — DX: Hyperlipidemia, unspecified: E78.5

## 2012-07-02 HISTORY — DX: Cardiac murmur, unspecified: R01.1

## 2012-07-02 LAB — BASIC METABOLIC PANEL
BUN: 9 mg/dL (ref 6–23)
Creatinine, Ser: 0.8 mg/dL (ref 0.50–1.10)
GFR calc Af Amer: >90 mL/min (ref >90)
GFR calc non Af Amer: >90 mL/min (ref >90)

## 2012-07-02 LAB — CBC
HCT: 37.9 % (ref 36.0–46.0)
Platelets: 383 10*3/uL (ref 150–400)

## 2012-07-02 NOTE — Patient Instructions (Addendum)
   Your procedure is scheduled on: Thursday, Jan 2  Enter through the [employer] of [facility] at: 6 am Pick up the phone at the desk and dial [phone] and inform us of your arrival.  Please call this number if you have any problems the morning of surgery: [phone]  Remember: Do not eat food after midnight: Wednesday Do not drink clear liquids after: Wednesday Take these medicines the morning of surgery with a SIP OF WATER:  Pt will bring Albuterol inhaler with her on day of surgery.  Will take dexilant and dulera if needed.  Do not wear jewelry, make-up, or FINGER nail polish No metal in your hair or on your body. Do not wear lotions, powders, perfumes. You may wear deodorant.  Please use your CHG wash as directed prior to surgery.  Do not shave anywhere for at least 12 hours prior to first CHG shower.  Do not bring valuables to the hospital. Contacts, dentures or bridgework may not be worn into surgery.   Patients discharged on the day of surgery will not be allowed to drive home.  Home with husband Devoiry Corriher.

## 2012-07-02 NOTE — H&P (Addendum)
  Marilyn Gibson [birth year] [id number]   History and Physical  Chief complaint: desires permanent sterilization  History of present illness: 41 y.o. [id number] who desires permanent sterilization after counseling for alternatives as noted in 06/07/2012 note.  Patient is admitted for laparoscopic tubal sterilization.  Past medical history,surgical history, medications, allergies, family history and social history were all reviewed and documented in the EPIC chart. ROS:  Was performed and pertinent positives and negatives are included in the history of present illness.  General: well developed, well nourished female, no acute distress HEENT: normal  Lungs: clear to auscultation without wheezing, rales or rhonchi  Cardiac: regular rate without rubs, murmurs or gallops  Abdomen: soft, nontender without masses, guarding, rebound, organomegaly  Pelvic: external bus vagina: normal   Cervix: grossly normal  Uterus: normal size, midline and mobile, nontender  Adnexa: without masses or tenderness      Assessment/Plan:  41 y.o. [id number] Patient wants to proceed with tubal sterilization. I again reviewed more conservative options she has issues with headaches when using hormonal contraception to include pills as well as progesterone only as well as her Mirena IUD. Nonhormonal IUD and vasectomy discussed and rejected. I reviewed what is involved with laparoscopy and tubal sterilization. I again discussed the SGO statement about salpingectomies as a risk reductive surgery for peritoneal cancer and options for salpingectomy versus banding or clipping discussed. Patient agrees and wants to proceed with salpingectomies. Absolute irreversible sterility with bilateral salpingectomies discussed as well as the potential for failure and pregnancy following sterilization. The expected intraoperative postoperative courses to include general anesthesia, trocar placement, multiple port sites, use of electrocautery harmonic  scalpel sharp blunt dissection reviewed. The risks of infection, prolonged antibiotics, hemorrhage necessitating transfusion and the risks of transfusion including transfusion reaction hepatitis HIV mad cow disease and other unknown entities were reviewed. Incisional complications requiring opening and draining of incisions, closure by secondary intention and long-term issues of cosmetics/keloid and hernia formation reviewed. The risks of inadvertent injury to internal organs either immediately recognized or delay recognized including bowel bladder ureters vessels and nerves necessitating major exploratory reparative surgeries and future reparative surgeries including ostomy formation, bowel resection, bladder repair, ureteral damage repair all discussed understood and accepted. The patient's questions were answered to her satisfaction and she is ready to proceed with surgery.       Dara Lords MD, 5:04 PM 07/02/2012

## 2012-07-03 HISTORY — PX: BREAST EXCISIONAL BIOPSY: SUR124

## 2012-07-03 HISTORY — PX: BREAST BIOPSY: SHX20

## 2012-07-03 MED ORDER — CLINDAMYCIN PHOSPHATE 900 MG/50ML IV SOLN
900.0000 mg | INTRAVENOUS | Status: AC
  Administered 2012-07-04: 900 mg via INTRAVENOUS
  Filled 2012-07-03: qty 50

## 2012-07-03 MED ORDER — CIPROFLOXACIN IN D5W 400 MG/200ML IV SOLN
400.0000 mg | INTRAVENOUS | Status: AC
  Administered 2012-07-04: 400 mg via INTRAVENOUS
  Filled 2012-07-03: qty 200

## 2012-07-04 ENCOUNTER — Encounter (HOSPITAL_COMMUNITY)
Admission: RE | Disposition: A | Discharge status: Home / Self Care | Payer: Self-pay | Source: Ambulatory Visit | Attending: Gynecology

## 2012-07-04 ENCOUNTER — Encounter (HOSPITAL_COMMUNITY): Payer: Self-pay | Admitting: *Deleted

## 2012-07-04 ENCOUNTER — Encounter (HOSPITAL_COMMUNITY): Payer: Self-pay | Admitting: Anesthesiology

## 2012-07-04 ENCOUNTER — Ambulatory Visit (HOSPITAL_COMMUNITY)
Admission: RE | Admit: 2012-07-04 | Discharge: 2012-07-04 | Disposition: A | Discharge status: Home / Self Care | Payer: PRIVATE HEALTH INSURANCE | Source: Ambulatory Visit | Attending: Gynecology | Admitting: Gynecology

## 2012-07-04 ENCOUNTER — Ambulatory Visit (HOSPITAL_COMMUNITY): Payer: PRIVATE HEALTH INSURANCE | Admitting: Anesthesiology

## 2012-07-04 DIAGNOSIS — Z302 Encounter for sterilization: Secondary | ICD-10-CM

## 2012-07-04 DIAGNOSIS — Z01812 Encounter for preprocedural laboratory examination: Secondary | ICD-10-CM | POA: Insufficient documentation

## 2012-07-04 DIAGNOSIS — Z01818 Encounter for other preprocedural examination: Secondary | ICD-10-CM | POA: Insufficient documentation

## 2012-07-04 HISTORY — PX: BILATERAL SALPINGECTOMY: SHX5743

## 2012-07-04 HISTORY — PX: LAPAROSCOPY: SHX197

## 2012-07-04 LAB — HCG, SERUM, QUALITATIVE: Preg, Serum: NEGATIVE

## 2012-07-04 SURGERY — LAPAROSCOPY OPERATIVE
Anesthesia: General | Site: Abdomen | Wound class: Clean Contaminated

## 2012-07-04 MED ORDER — DEXAMETHASONE SODIUM PHOSPHATE 10 MG/ML IJ SOLN
INTRAMUSCULAR | Status: AC
  Filled 2012-07-04: qty 1

## 2012-07-04 MED ORDER — FENTANYL CITRATE 0.05 MG/ML IJ SOLN
INTRAMUSCULAR | Status: DC | PRN
  Administered 2012-07-04: 100 ug via INTRAVENOUS
  Administered 2012-07-04: 50 ug via INTRAVENOUS
  Administered 2012-07-04: 100 ug via INTRAVENOUS

## 2012-07-04 MED ORDER — ROCURONIUM BROMIDE 50 MG/5ML IV SOLN
INTRAVENOUS | Status: AC
  Filled 2012-07-04: qty 1

## 2012-07-04 MED ORDER — HYDROCODONE-ACETAMINOPHEN 5-500 MG PO TABS: 1.0000 | ORAL_TABLET | Freq: Four times a day (QID) | ORAL | Status: DC | PRN

## 2012-07-04 MED ORDER — PROPOFOL 10 MG/ML IV EMUL
INTRAVENOUS | Status: AC
  Filled 2012-07-04: qty 20

## 2012-07-04 MED ORDER — KETOROLAC TROMETHAMINE 30 MG/ML IJ SOLN
15.0000 mg | Freq: Once | INTRAMUSCULAR | Status: AC | PRN
  Administered 2012-07-04: 30 mg via INTRAVENOUS

## 2012-07-04 MED ORDER — BUPIVACAINE HCL (PF) 0.25 % IJ SOLN
INTRAMUSCULAR | Status: DC | PRN
  Administered 2012-07-04: 5 mL

## 2012-07-04 MED ORDER — LIDOCAINE HCL (CARDIAC) 20 MG/ML IV SOLN
INTRAVENOUS | Status: AC
  Filled 2012-07-04: qty 5

## 2012-07-04 MED ORDER — MIDAZOLAM HCL 5 MG/5ML IJ SOLN
INTRAMUSCULAR | Status: DC | PRN
  Administered 2012-07-04: 2 mg via INTRAVENOUS

## 2012-07-04 MED ORDER — DEXAMETHASONE SODIUM PHOSPHATE 10 MG/ML IJ SOLN
INTRAMUSCULAR | Status: DC | PRN
  Administered 2012-07-04: 10 mg via INTRAVENOUS

## 2012-07-04 MED ORDER — PROPOFOL 10 MG/ML IV BOLUS
INTRAVENOUS | Status: DC | PRN
  Administered 2012-07-04: 200 mg via INTRAVENOUS
  Administered 2012-07-04: 100 mg via INTRAVENOUS

## 2012-07-04 MED ORDER — LIDOCAINE HCL (CARDIAC) 20 MG/ML IV SOLN
INTRAVENOUS | Status: DC | PRN
  Administered 2012-07-04: 50 mg via INTRAVENOUS

## 2012-07-04 MED ORDER — ROCURONIUM BROMIDE 100 MG/10ML IV SOLN
INTRAVENOUS | Status: DC | PRN
  Administered 2012-07-04: 30 mg via INTRAVENOUS

## 2012-07-04 MED ORDER — NEOSTIGMINE METHYLSULFATE 1 MG/ML IJ SOLN
INTRAMUSCULAR | Status: AC
  Filled 2012-07-04: qty 1

## 2012-07-04 MED ORDER — BUPIVACAINE HCL (PF) 0.25 % IJ SOLN
INTRAMUSCULAR | Status: AC
  Filled 2012-07-04: qty 30

## 2012-07-04 MED ORDER — DEXTROSE IN LACTATED RINGERS 5 % IV SOLN: INTRAVENOUS | Status: DC

## 2012-07-04 MED ORDER — LACTATED RINGERS IV SOLN
INTRAVENOUS | Status: DC
  Administered 2012-07-04 (×2): via INTRAVENOUS

## 2012-07-04 MED ORDER — MIDAZOLAM HCL 2 MG/2ML IJ SOLN
INTRAMUSCULAR | Status: AC
  Filled 2012-07-04: qty 2

## 2012-07-04 MED ORDER — KETOROLAC TROMETHAMINE 30 MG/ML IJ SOLN
INTRAMUSCULAR | Status: AC
  Filled 2012-07-04: qty 1

## 2012-07-04 MED ORDER — GLYCOPYRROLATE 0.2 MG/ML IJ SOLN
INTRAMUSCULAR | Status: AC
  Filled 2012-07-04: qty 2

## 2012-07-04 MED ORDER — ONDANSETRON HCL 4 MG/2ML IJ SOLN
INTRAMUSCULAR | Status: AC
  Filled 2012-07-04: qty 2

## 2012-07-04 MED ORDER — MEPERIDINE HCL 25 MG/ML IJ SOLN: 6.2500 mg | INTRAMUSCULAR | Status: DC | PRN

## 2012-07-04 MED ORDER — MIDAZOLAM HCL 2 MG/2ML IJ SOLN: 0.5000 mg | Freq: Once | INTRAMUSCULAR | Status: DC | PRN

## 2012-07-04 MED ORDER — FENTANYL CITRATE 0.05 MG/ML IJ SOLN
INTRAMUSCULAR | Status: AC
  Filled 2012-07-04: qty 5

## 2012-07-04 MED ORDER — GLYCOPYRROLATE 0.2 MG/ML IJ SOLN
INTRAMUSCULAR | Status: DC | PRN
  Administered 2012-07-04: 0.6 mg via INTRAVENOUS

## 2012-07-04 MED ORDER — ONDANSETRON HCL 4 MG/2ML IJ SOLN
INTRAMUSCULAR | Status: DC | PRN
  Administered 2012-07-04: 4 mg via INTRAVENOUS

## 2012-07-04 MED ORDER — PROMETHAZINE HCL 25 MG/ML IJ SOLN: 6.2500 mg | INTRAMUSCULAR | Status: DC | PRN

## 2012-07-04 MED ORDER — NEOSTIGMINE METHYLSULFATE 1 MG/ML IJ SOLN
INTRAMUSCULAR | Status: DC | PRN
  Administered 2012-07-04: 4 mg via INTRAVENOUS

## 2012-07-04 MED ORDER — KETOROLAC TROMETHAMINE 30 MG/ML IJ SOLN
INTRAMUSCULAR | Status: AC
  Administered 2012-07-04: 30 mg via INTRAVENOUS
  Filled 2012-07-04: qty 1

## 2012-07-04 MED ORDER — FENTANYL CITRATE 0.05 MG/ML IJ SOLN: 25.0000 ug | INTRAMUSCULAR | Status: DC | PRN

## 2012-07-04 SURGICAL SUPPLY — 20 items
ADH SKN CLS APL DERMABOND .7 (GAUZE/BANDAGES/DRESSINGS) ×2
BLADE SURG 15 STRL LF C SS BP (BLADE) ×2 IMPLANT
BLADE SURG 15 STRL SS (BLADE) ×3
CATH ROBINSON RED A/P 16FR (CATHETERS) ×3 IMPLANT
CLOTH BEACON ORANGE TIMEOUT ST (SAFETY) ×3 IMPLANT
DERMABOND ADVANCED (GAUZE/BANDAGES/DRESSINGS) ×1
DERMABOND ADVANCED .7 DNX12 (GAUZE/BANDAGES/DRESSINGS) IMPLANT
GLOVE BIO SURGEON STRL SZ7.5 (GLOVE) ×6 IMPLANT
GOWN STRL REIN XL XLG (GOWN DISPOSABLE) ×2 IMPLANT
NS IRRIG 1000ML POUR BTL (IV SOLUTION) ×3 IMPLANT
PACK LAPAROSCOPY BASIN (CUSTOM PROCEDURE TRAY) ×3 IMPLANT
PROTECTOR NERVE ULNAR (MISCELLANEOUS) ×3 IMPLANT
SCALPEL HARMONIC ACE (MISCELLANEOUS) ×1 IMPLANT
SUT PLAIN 4 0 FS 2 27 (SUTURE) ×3 IMPLANT
SUT VICRYL 0 UR6 27IN ABS (SUTURE) ×3 IMPLANT
TOWEL OR 17X24 6PK STRL BLUE (TOWEL DISPOSABLE) ×6 IMPLANT
TROCAR XCEL NON-BLD 11X100MML (ENDOMECHANICALS) ×3 IMPLANT
TROCAR XCEL NON-BLD 5MMX100MML (ENDOMECHANICALS) ×4 IMPLANT
WARMER LAPAROSCOPE (MISCELLANEOUS) ×3 IMPLANT
WATER STERILE IRR 1000ML POUR (IV SOLUTION) ×3 IMPLANT

## 2012-07-04 NOTE — Anesthesia Postprocedure Evaluation (Signed)
  Anesthesia Post-op Note  Patient: Marilyn Gibson  Procedure(s) Performed: Procedure(s) (LRB) with comments: LAPAROSCOPY OPERATIVE (N/A) BILATERAL SALPINGECTOMY (Bilateral)  Patient is awake and responsive. Pain and nausea are reasonably well controlled. Vital signs are stable and clinically acceptable. Oxygen saturation is clinically acceptable. There are no apparent anesthetic complications at this time. Patient is ready for discharge.

## 2012-07-04 NOTE — H&P (Signed)
  The patient was examined.  I reviewed the proposed surgery and consent form with the patient.  The dictated history and physical is current and accurate and all questions were answered. The patient is ready to proceed with surgery and has a realistic understanding and expectation for the outcome.   Dara Lords MD, 7:07 AM 07/04/2012

## 2012-07-04 NOTE — Anesthesia Preprocedure Evaluation (Addendum)
Anesthesia Evaluation  Patient identified by MRN, date of birth, ID band Patient awake    Reviewed: Allergy & Precautions, H&P , Patient's Chart, lab work & pertinent test results, reviewed documented beta blocker date and time   History of Anesthesia Complications Negative for: history of anesthetic complications  Airway Mallampati: III TM Distance: >3 FB Neck ROM: full    Dental No notable dental hx.    Pulmonary neg pulmonary ROS, asthma ,  breath sounds clear to auscultation  Pulmonary exam normal       Cardiovascular Exercise Tolerance: Good hypertension, negative cardio ROS  Rhythm:regular Rate:Normal     Neuro/Psych negative neurological ROS  negative psych ROS   GI/Hepatic negative GI ROS, Neg liver ROS, GERD-  Controlled,  Endo/Other  negative endocrine ROSHyperthyroidism   Renal/GU negative Renal ROS     Musculoskeletal   Abdominal   Peds  Hematology negative hematology ROS (+)   Anesthesia Other Findings GERD (gastroesophageal reflux disease)     History of migraines        Hypertension     Cervical dysplasia   as teenager, normal since    Bronchitis     Seasonal allergies        Asthma     Headache   tx w lexapro    Hyperlipidemia   no meds Heart murmur   as a child, no problem as adult    Grave's disease   dx w/ 1st child 2002 but then after delivery, graves dis went away Anemia           Reproductive/Obstetrics negative OB ROS                          Anesthesia Physical Anesthesia Plan  ASA: II  Anesthesia Plan: General ETT   Post-op Pain Management:    Induction:   Airway Management Planned:   Additional Equipment:   Intra-op Plan:   Post-operative Plan:   Informed Consent: I have reviewed the patients History and Physical, chart, labs and discussed the procedure including the risks, benefits and alternatives for the proposed anesthesia with the patient or  authorized representative who has indicated his/her understanding and acceptance.   Dental Advisory Given  Plan Discussed with: CRNA and Surgeon  Anesthesia Plan Comments:        Anesthesia Quick Evaluation

## 2012-07-04 NOTE — Transfer of Care (Signed)
Immediate Anesthesia Transfer of Care Note  Patient: Marilyn Gibson  Procedure(s) Performed: Procedure(s) (LRB) with comments: LAPAROSCOPY OPERATIVE (N/A) BILATERAL SALPINGECTOMY (Bilateral)  Patient Location: PACU  Anesthesia Type:General  Level of Consciousness: sedated  Airway & Oxygen Therapy: Patient Spontanous Breathing and Patient connected to nasal cannula oxygen  Post-op Assessment: Report given to PACU RN and Post -op Vital signs reviewed and stable  Post vital signs: stable  Complications: No apparent anesthesia complications

## 2012-07-04 NOTE — Op Note (Signed)
Marilyn Gibson [birth year] [id number]   Post Operative Note   Date of surgery:  07/04/2012  Pre Op Dx:  Desires permanent sterilization  Post Op Dx:  Desires permanent sterilization  Procedure:  Laparoscopic bilateral salpingectomies  Surgeon:  Dara Lords  Anesthesia:  General  EBL:  minimal  Complications:  None  Specimen:  Right and left fallopian tubes to pathology  Findings: EUA:  External BUS vagina normal. Cervix normal. Uterus normal size midline mobile. Adnexa without masses   Operative:  Anterior cul-de-sac normal. The posterior cul-de-sac normal. Uterus normal size shape and contour. Right and left fallopian tubes normal length caliber and fimbriated ends. Right and left ovaries grossly normal, free and mobile. No evidence of pelvic endometriosis or adhesive disease. Upper abdominal exam shows appendix grossly normal free and mobile. Liver/gallbladder not visualized due to omentum covering.  Procedure:  The patient was taken to the operating room, underwent general anesthesia, was placed in the low dorsal lithotomy position, received abdominal/perineal/vaginal preparation with Betadine scrub and solution, an in and out Foley catheterization performed and an EUA was performed.  A time out was performed by the surgical team. The cervix was visualized with a speculum, grasped with a single-tooth tenaculum and a Hulka tenaculum was placed without difficulty and the patient was draped in the usual fashion. A transverse infraumbilical incision was made and using the Optiview 10 mm direct entry trocar the abdomen was directly entered without difficulty under direct visualization and subsequently insufflated. Right and left 5 mm suprapubic ports were then placed under direct visualization after transillumination of the vessels without difficulty and examination of the pelvic organs and upper abdominal exam was carried out with findings noted above.  The right fallopian tube was  identified elevated and using the harmonic scalpel was freed from its attachments to the mesosalpinx and transected at its insertion to the uterine cornua without difficulty. Care was taken to avoid the infundibulopelvic pedicle and uterine ovarian pedicle as well as the ureter was identified away from the surgical site. A similar procedure was carried out on the other side. Both specimens were placed in the anterior cul-de-sac and subsequently the operative laparoscope was placed through the infraumbilical port and the specimens were retrieved and sent to pathology. The abdominal pressure was dropped and reinspection of the broad ligament showed adequate hemostasis bilaterally. The 5 mm ports were then removed under direct visualization showing adequate hemostasis and the infraumbilical port was back out under direct visualization showing adequate hemostasis and no evidence of hernia formation. All skin incisions were injected using 0.25% Marcaine and the infraumbilical incision was closed using a 0 Vicryl suture in an interrupted subcutaneous fascial stitch. The infraumbilical and right suprapubic skin incisions were closed using Dermabond skin adhesive and due to oozing in the left suprapubic port site and interrupted 4-0 plain suture was placed to reapproximate the skin and achieve hemostasis.  The Hulka tenaculum was removed, the patient received intraoperative Toradol, was placed in the supine position and awakened without difficulty and taken to the recovery room having tolerated the procedure well.    Dara Lords MD, 8:40 AM 07/04/2012

## 2012-07-05 ENCOUNTER — Encounter (HOSPITAL_COMMUNITY): Payer: Self-pay | Admitting: Gynecology

## 2012-07-09 ENCOUNTER — Telehealth: Payer: Self-pay

## 2012-07-09 NOTE — Telephone Encounter (Signed)
On 07/04/12 patient had laparoscopic salpingectomy with Dr. Velvet Bathe.  She said that he put some "glue" on her incisions.  She said some of it has come off in the shower but some of it is still hanging on and is starting to itch and irritate her.  She wants to know if okay to remove it.  Otherwise she said her incisions are fine.

## 2012-07-09 NOTE — Telephone Encounter (Signed)
Patient called today to let me know that her insurance changed Jan 1  And she needed to give me new info so we can file her surgery to correct insurance co.  I looked in system and she had provided hospital with a paper that had the info on it at her pre-op visit.  They scanned the paper in media manager and updated the ins in the system.  Patient is also concerned because her card says on the back that precertification is required for hospital confinement and for outpatient surgery.  I called [phone]) and spoke with [place] who checked the code and said no precertification was required. Patient was informed.

## 2012-07-09 NOTE — Telephone Encounter (Signed)
She can remove it in the shower to make it easier for her or with small fine scissors cut it off close to the skin without having to peel off what still may be stuck.

## 2012-07-10 NOTE — Telephone Encounter (Signed)
Patient informed. 

## 2012-07-18 ENCOUNTER — Ambulatory Visit (INDEPENDENT_AMBULATORY_CARE_PROVIDER_SITE_OTHER): Payer: PRIVATE HEALTH INSURANCE | Admitting: Gynecology

## 2012-07-18 ENCOUNTER — Encounter: Payer: Self-pay | Admitting: Gynecology

## 2012-07-18 DIAGNOSIS — Z09 Encounter for follow-up examination after completed treatment for conditions other than malignant neoplasm: Secondary | ICD-10-CM

## 2012-07-18 NOTE — Patient Instructions (Signed)
Follow up in November for your annual exam Follow up for your mammogram as scheduled

## 2012-07-18 NOTE — Progress Notes (Signed)
Patient presents for her postoperative visit from her laparoscopic bilateral salpingectomies for tubal sterilization. She's done well without complaints.  Examination with Kim assistant Abdomen soft nontender without masses guarding rebound organomegaly. Incisions healed nicely. 1 chromic suture left lower port remaining and removed.  Pelvic external BUS vagina normal. Bimanual without masses or tenderness.  Assessment and plan: Normal postop check status post laparoscopic salpingectomy for tubal sterilization. Doing well. Reviewed findings of surgery with her to include pictures and pathology showing benign fallopian tubes. Patient will follow up in November when she is due for her annual, sooner as needed. She does have a mammogram scheduled and is following up for this.

## 2012-12-30 ENCOUNTER — Other Ambulatory Visit: Payer: Self-pay | Admitting: Gynecology

## 2012-12-30 DIAGNOSIS — Z1231 Encounter for screening mammogram for malignant neoplasm of breast: Secondary | ICD-10-CM

## 2013-01-10 ENCOUNTER — Ambulatory Visit (HOSPITAL_COMMUNITY): Admission: RE | Admit: 2013-01-10 | Payer: PRIVATE HEALTH INSURANCE | Source: Ambulatory Visit

## 2013-01-15 ENCOUNTER — Ambulatory Visit (HOSPITAL_COMMUNITY)
Admission: RE | Admit: 2013-01-15 | Discharge: 2013-01-15 | Disposition: A | Discharge status: Home / Self Care | Payer: PRIVATE HEALTH INSURANCE | Source: Ambulatory Visit | Attending: Gynecology | Admitting: Gynecology

## 2013-01-15 DIAGNOSIS — Z1231 Encounter for screening mammogram for malignant neoplasm of breast: Secondary | ICD-10-CM | POA: Insufficient documentation

## 2013-01-17 ENCOUNTER — Other Ambulatory Visit: Payer: Self-pay | Admitting: Gynecology

## 2013-01-17 DIAGNOSIS — R928 Other abnormal and inconclusive findings on diagnostic imaging of breast: Secondary | ICD-10-CM

## 2013-01-24 ENCOUNTER — Other Ambulatory Visit: Payer: Self-pay | Admitting: Gynecology

## 2013-01-24 ENCOUNTER — Ambulatory Visit
Admission: RE | Admit: 2013-01-24 | Discharge: 2013-01-24 | Disposition: A | Discharge status: Home / Self Care | Payer: PRIVATE HEALTH INSURANCE | Source: Ambulatory Visit | Attending: Gynecology | Admitting: Gynecology

## 2013-01-24 DIAGNOSIS — R928 Other abnormal and inconclusive findings on diagnostic imaging of breast: Secondary | ICD-10-CM

## 2013-01-24 DIAGNOSIS — R921 Mammographic calcification found on diagnostic imaging of breast: Secondary | ICD-10-CM

## 2013-02-12 ENCOUNTER — Ambulatory Visit
Admission: RE | Admit: 2013-02-12 | Discharge: 2013-02-12 | Disposition: A | Discharge status: Home / Self Care | Payer: PRIVATE HEALTH INSURANCE | Source: Ambulatory Visit | Attending: Gynecology | Admitting: Gynecology

## 2013-02-12 ENCOUNTER — Other Ambulatory Visit (HOSPITAL_COMMUNITY): Payer: Self-pay | Admitting: Diagnostic Radiology

## 2013-02-12 DIAGNOSIS — R921 Mammographic calcification found on diagnostic imaging of breast: Secondary | ICD-10-CM

## 2013-02-12 DIAGNOSIS — R928 Other abnormal and inconclusive findings on diagnostic imaging of breast: Secondary | ICD-10-CM

## 2013-05-27 ENCOUNTER — Encounter: Payer: Self-pay | Admitting: Gynecology

## 2013-05-27 ENCOUNTER — Ambulatory Visit (INDEPENDENT_AMBULATORY_CARE_PROVIDER_SITE_OTHER): Payer: No Typology Code available for payment source | Admitting: Gynecology

## 2013-05-27 VITALS — BP 124/78 | Ht 64.0 in | Wt 178.0 lb

## 2013-05-27 DIAGNOSIS — Z01419 Encounter for gynecological examination (general) (routine) without abnormal findings: Secondary | ICD-10-CM

## 2013-05-27 LAB — CBC WITH DIFFERENTIAL/PLATELET
Eosinophils Relative: 3 % (ref 0–5)
HCT: 35.8 % — ABNORMAL LOW (ref 36.0–46.0)
Hemoglobin: 11.9 g/dL — ABNORMAL LOW (ref 12.0–15.0)
Lymphocytes Relative: 38 % (ref 12–46)
MCHC: 33.2 g/dL (ref 30.0–36.0)
MCV: 82.7 fL (ref 78.0–100.0)
Monocytes Absolute: 0.7 10*3/uL (ref 0.1–1.0)
Monocytes Relative: 12 % (ref 3–12)
Neutro Abs: 2.5 10*3/uL (ref 1.7–7.7)
RDW: 15.7 % — ABNORMAL HIGH (ref 11.5–15.5)
WBC: 5.4 10*3/uL (ref 4.0–10.5)

## 2013-05-27 NOTE — Progress Notes (Signed)
Marilyn Gibson [birth year] [id number]        42 y.o.  [id number] for annual exam.  Doing well.  Past medical history,surgical history, problem list, medications, allergies, family history and social history were all reviewed and documented in the EPIC chart.  ROS:  Performed and pertinent positives and negatives are included in the history, assessment and plan .  Exam: Kim assistant Filed Vitals:   05/27/13 1603  BP: 124/78  Height: 5\' 4"  (1.626 m)  Weight: 178 lb (80.74 kg)   General appearance  Normal Skin grossly normal Head/Neck normal with no cervical or supraclavicular adenopathy thyroid normal Lungs  clear Cardiac RR, without RMG Abdominal  soft, nontender, without masses, organomegaly or hernia Breasts  examined lying and sitting without masses, retractions, discharge or axillary adenopathy. Pelvic  Ext/BUS/vagina  Normal   Cervix  Normal   Uterus  anteverted, normal size, shape and contour, midline and mobile nontender   Adnexa  Without masses or tenderness    Anus and perineum  Normal   Rectovaginal  Normal sphincter tone without palpated masses or tenderness.    Assessment/Plan:  42 y.o. [id number] female for annual exam, regular menses, bilateral salpingectomy tubal sterilization.   1. Regular menses, tubal sterilization with bilateral salpingectomy. Doing well without complaints. 2. Pap smear 2012. No Pap smear done today. History of cervical dysplasia as a teenager with normal Pap smears since then. Plan repeat Pap smear next year at 3 year interval. 3. Mammography 12/2012. Ultimately had a biopsy right breast due to calcifications. Biopsy was benign and she is due for a six-month followup mammogram and she knows the importance of followup. SBE monthly reviewed. 4. Health maintenance. Is being followed for medical issues but has not had routine blood work done in some time. She asked if I would do her screening blood work I ordered a CBC, comprehensive metabolic panel, lipid  profile, urinalysis, TSH, hemoglobin A1c. Followup in one year, sooner as needed.   Note: This document was prepared with digital dictation and possible smart phrase technology. Any transcriptional errors that result from this process are unintentional.   Dara Lords MD, 4:34 PM 05/27/2013

## 2013-05-27 NOTE — Patient Instructions (Signed)
Followup in one year for annual exam. Sooner if any issues. 

## 2013-05-28 LAB — URINALYSIS W MICROSCOPIC + REFLEX CULTURE
Bilirubin Urine: NEGATIVE
Casts: NONE SEEN
Glucose, UA: NEGATIVE mg/dL
Hgb urine dipstick: NEGATIVE
Ketones, ur: NEGATIVE mg/dL
Leukocytes, UA: NEGATIVE
Protein, ur: NEGATIVE mg/dL
pH: 5.5 (ref 5.0–8.0)

## 2013-05-28 LAB — LIPID PANEL
Cholesterol: 261 mg/dL — ABNORMAL HIGH (ref 0–200)
HDL: 64 mg/dL (ref >39)
Total CHOL/HDL Ratio: 4.1 Ratio
Triglycerides: 83 mg/dL (ref <150)

## 2013-05-28 LAB — COMPREHENSIVE METABOLIC PANEL
Albumin: 4.4 g/dL (ref 3.5–5.2)
BUN: 8 mg/dL (ref 6–23)
CO2: 28 mEq/L (ref 19–32)
Calcium: 9.3 mg/dL (ref 8.4–10.5)
Chloride: 99 mEq/L (ref 96–112)
Creat: 0.84 mg/dL (ref 0.50–1.10)
Glucose, Bld: 89 mg/dL (ref 70–99)
Potassium: 4.1 mEq/L (ref 3.5–5.3)

## 2013-05-28 LAB — TSH: TSH: 1.026 u[IU]/mL (ref 0.350–4.500)

## 2013-08-27 ENCOUNTER — Other Ambulatory Visit: Payer: Self-pay | Admitting: Gynecology

## 2013-08-27 DIAGNOSIS — N6019 Diffuse cystic mastopathy of unspecified breast: Secondary | ICD-10-CM

## 2013-09-08 ENCOUNTER — Ambulatory Visit
Admission: RE | Admit: 2013-09-08 | Discharge: 2013-09-08 | Disposition: A | Discharge status: Home / Self Care | Payer: BC Managed Care – PPO | Source: Ambulatory Visit | Attending: Gynecology | Admitting: Gynecology

## 2013-09-08 DIAGNOSIS — N6019 Diffuse cystic mastopathy of unspecified breast: Secondary | ICD-10-CM

## 2014-02-09 ENCOUNTER — Other Ambulatory Visit: Payer: Self-pay

## 2014-02-09 DIAGNOSIS — Z1231 Encounter for screening mammogram for malignant neoplasm of breast: Secondary | ICD-10-CM

## 2014-02-23 ENCOUNTER — Ambulatory Visit
Admission: RE | Admit: 2014-02-23 | Discharge: 2014-02-23 | Disposition: A | Discharge status: Home / Self Care | Payer: BC Managed Care – PPO | Source: Ambulatory Visit

## 2014-02-23 DIAGNOSIS — Z1231 Encounter for screening mammogram for malignant neoplasm of breast: Secondary | ICD-10-CM

## 2014-05-04 ENCOUNTER — Encounter: Payer: Self-pay | Admitting: Gynecology

## 2014-06-30 ENCOUNTER — Encounter: Payer: Self-pay | Admitting: Gynecology

## 2014-06-30 ENCOUNTER — Ambulatory Visit (INDEPENDENT_AMBULATORY_CARE_PROVIDER_SITE_OTHER): Payer: BC Managed Care – PPO | Admitting: Gynecology

## 2014-06-30 ENCOUNTER — Other Ambulatory Visit (HOSPITAL_COMMUNITY)
Admission: RE | Admit: 2014-06-30 | Discharge: 2014-06-30 | Disposition: A | Discharge status: Home / Self Care | Payer: BLUE CROSS/BLUE SHIELD | Source: Ambulatory Visit | Attending: Gynecology | Admitting: Gynecology

## 2014-06-30 VITALS — BP 130/82 | Ht 62.5 in | Wt 177.0 lb

## 2014-06-30 DIAGNOSIS — Z01419 Encounter for gynecological examination (general) (routine) without abnormal findings: Secondary | ICD-10-CM | POA: Insufficient documentation

## 2014-06-30 DIAGNOSIS — Z1151 Encounter for screening for human papillomavirus (HPV): Secondary | ICD-10-CM | POA: Insufficient documentation

## 2014-06-30 NOTE — Patient Instructions (Signed)
You may obtain a copy of any labs that were done today by logging onto MyChart as outlined in the instructions provided with your AVS (after visit summary). The office will not call with normal lab results but certainly if there are any significant abnormalities then we will contact you.   Health Maintenance, Female A healthy lifestyle and preventative care can promote health and wellness.  Maintain regular health, dental, and eye exams.  Eat a healthy diet. Foods like vegetables, fruits, whole grains, low-fat dairy products, and lean protein foods contain the nutrients you need without too many calories. Decrease your intake of foods high in solid fats, added sugars, and salt. Get information about a proper diet from your caregiver, if necessary.  Regular physical exercise is one of the most important things you can do for your health. Most adults should get at least 150 minutes of moderate-intensity exercise (any activity that increases your heart rate and causes you to sweat) each week. In addition, most adults need muscle-strengthening exercises on 2 or more days a week.   Maintain a healthy weight. The body mass index (BMI) is a screening tool to identify possible weight problems. It provides an estimate of body fat based on height and weight. Your caregiver can help determine your BMI, and can help you achieve or maintain a healthy weight. For adults 20 years and older:  A BMI below 18.5 is considered underweight.  A BMI of 18.5 to 24.9 is normal.  A BMI of 25 to 29.9 is considered overweight.  A BMI of 30 and above is considered obese.  Maintain normal blood lipids and cholesterol by exercising and minimizing your intake of saturated fat. Eat a balanced diet with plenty of fruits and vegetables. Blood tests for lipids and cholesterol should begin at age 61 and be repeated every 5 years. If your lipid or cholesterol levels are high, you are over 50, or you are a high risk for heart  disease, you may need your cholesterol levels checked more frequently.Ongoing high lipid and cholesterol levels should be treated with medicines if diet and exercise are not effective.  If you smoke, find out from your caregiver how to quit. If you do not use tobacco, do not start.  Lung cancer screening is recommended for adults aged 33 80 years who are at high risk for developing lung cancer because of a history of smoking. Yearly low-dose computed tomography (CT) is recommended for people who have at least a 30-pack-year history of smoking and are a current smoker or have quit within the past 15 years. A pack year of smoking is smoking an average of 1 pack of cigarettes a day for 1 year (for example: 1 pack a day for 30 years or 2 packs a day for 15 years). Yearly screening should continue until the smoker has stopped smoking for at least 15 years. Yearly screening should also be stopped for people who develop a health problem that would prevent them from having lung cancer treatment.  If you are pregnant, do not drink alcohol. If you are breastfeeding, be very cautious about drinking alcohol. If you are not pregnant and choose to drink alcohol, do not exceed 1 drink per day. One drink is considered to be 12 ounces (355 mL) of beer, 5 ounces (148 mL) of wine, or 1.5 ounces (44 mL) of liquor.  Avoid use of street drugs. Do not share needles with anyone. Ask for help if you need support or instructions about stopping  the use of drugs.  High blood pressure causes heart disease and increases the risk of stroke. Blood pressure should be checked at least every 1 to 2 years. Ongoing high blood pressure should be treated with medicines, if weight loss and exercise are not effective.  If you are 59 to 43 years old, ask your caregiver if you should take aspirin to prevent strokes.  Diabetes screening involves taking a blood sample to check your fasting blood sugar level. This should be done once every 3  years, after age 91, if you are within normal weight and without risk factors for diabetes. Testing should be considered at a younger age or be carried out more frequently if you are overweight and have at least 1 risk factor for diabetes.  Breast cancer screening is essential preventative care for women. You should practice "breast self-awareness." This means understanding the normal appearance and feel of your breasts and may include breast self-examination. Any changes detected, no matter how small, should be reported to a caregiver. Women in their 66s and 30s should have a clinical breast exam (CBE) by a caregiver as part of a regular health exam every 1 to 3 years. After age 101, women should have a CBE every year. Starting at age 100, women should consider having a mammogram (breast X-ray) every year. Women who have a family history of breast cancer should talk to their caregiver about genetic screening. Women at a high risk of breast cancer should talk to their caregiver about having an MRI and a mammogram every year.  Breast cancer gene (BRCA)-related cancer risk assessment is recommended for women who have family members with BRCA-related cancers. BRCA-related cancers include breast, ovarian, tubal, and peritoneal cancers. Having family members with these cancers may be associated with an increased risk for harmful changes (mutations) in the breast cancer genes BRCA1 and BRCA2. Results of the assessment will determine the need for genetic counseling and BRCA1 and BRCA2 testing.  The Pap test is a screening test for cervical cancer. Women should have a Pap test starting at age 57. Between ages 25 and 35, Pap tests should be repeated every 2 years. Beginning at age 37, you should have a Pap test every 3 years as long as the past 3 Pap tests have been normal. If you had a hysterectomy for a problem that was not cancer or a condition that could lead to cancer, then you no longer need Pap tests. If you are  between ages 50 and 76, and you have had normal Pap tests going back 10 years, you no longer need Pap tests. If you have had past treatment for cervical cancer or a condition that could lead to cancer, you need Pap tests and screening for cancer for at least 20 years after your treatment. If Pap tests have been discontinued, risk factors (such as a new sexual partner) need to be reassessed to determine if screening should be resumed. Some women have medical problems that increase the chance of getting cervical cancer. In these cases, your caregiver may recommend more frequent screening and Pap tests.  The human papillomavirus (HPV) test is an additional test that may be used for cervical cancer screening. The HPV test looks for the virus that can cause the cell changes on the cervix. The cells collected during the Pap test can be tested for HPV. The HPV test could be used to screen women aged 44 years and older, and should be used in women of any age  who have unclear Pap test results. After the age of 55, women should have HPV testing at the same frequency as a Pap test.  Colorectal cancer can be detected and often prevented. Most routine colorectal cancer screening begins at the age of 44 and continues through age 20. However, your caregiver may recommend screening at an earlier age if you have risk factors for colon cancer. On a yearly basis, your caregiver may provide home test kits to check for hidden blood in the stool. Use of a small camera at the end of a tube, to directly examine the colon (sigmoidoscopy or colonoscopy), can detect the earliest forms of colorectal cancer. Talk to your caregiver about this at age 86, when routine screening begins. Direct examination of the colon should be repeated every 5 to 10 years through age 13, unless early forms of pre-cancerous polyps or small growths are found.  Hepatitis C blood testing is recommended for all people born from [birth year] through 1965 and any  individual with known risks for hepatitis C.  Practice safe sex. Use condoms and avoid high-risk sexual practices to reduce the spread of sexually transmitted infections (STIs). Sexually active women aged 36 and younger should be checked for Chlamydia, which is a common sexually transmitted infection. Older women with new or multiple partners should also be tested for Chlamydia. Testing for other STIs is recommended if you are sexually active and at increased risk.  Osteoporosis is a disease in which the bones lose minerals and strength with aging. This can result in serious bone fractures. The risk of osteoporosis can be identified using a bone density scan. Women ages 20 and over and women at risk for fractures or osteoporosis should discuss screening with their caregivers. Ask your caregiver whether you should be taking a calcium supplement or vitamin D to reduce the rate of osteoporosis.  Menopause can be associated with physical symptoms and risks. Hormone replacement therapy is available to decrease symptoms and risks. You should talk to your caregiver about whether hormone replacement therapy is right for you.  Use sunscreen. Apply sunscreen liberally and repeatedly throughout the day. You should seek shade when your shadow is shorter than you. Protect yourself by wearing long sleeves, pants, a wide-brimmed hat, and sunglasses year round, whenever you are outdoors.  Notify your caregiver of new moles or changes in moles, especially if there is a change in shape or color. Also notify your caregiver if a mole is larger than the size of a pencil eraser.  Stay current with your immunizations. Document Released: 01/02/2011 Document Revised: 10/14/2012 Document Reviewed: 01/02/2011 [facility] Patient Information 2014 [employer].

## 2014-06-30 NOTE — Progress Notes (Signed)
Marilyn Gibson April 21, 1971 [id number]        43 y.o.  [id number] for annual exam.  Several issues noted below.  Past medical history,surgical history, problem list, medications, allergies, family history and social history were all reviewed and documented as reviewed in the EPIC chart.  ROS:  Performed with pertinent positives and negatives included in the history, assessment and plan.   Additional significant findings :  none   Exam: Programmer, multimedia Vitals:   06/30/14 1554  BP: 130/82  Height: 5' 2.5" (1.588 m)  Weight: 177 lb (80.287 kg)   General appearance:  Normal affect, orientation and appearance. Skin: Grossly normal HEENT: Without gross lesions.  No cervical or supraclavicular adenopathy. Thyroid normal.  Lungs:  Clear without wheezing, rales or rhonchi Cardiac: RR, without RMG Abdominal:  Soft, nontender, without masses, guarding, rebound, organomegaly or hernia Breasts:  Examined lying and sitting without masses, retractions, discharge or axillary adenopathy. Pelvic:  Ext/BUS/vagina normal  Cervix normal. Pap/HPV  Uterus anteverted, normal size, shape and contour, midline and mobile nontender   Adnexa  Without masses or tenderness    Anus and perineum  Normal   Rectovaginal  Normal sphincter tone without palpated masses or tenderness.    Assessment/Plan:  43 y.o. [id number] female for annual exam with regular menses, bilateral salpingectomy history.   1. Regular menses. Patient is having some hot flashes throughout the month.  Recently evaluated by her primary to include a normal thyroid panel historically. Issues of possible lower estrogen levels discussed although with regular monthly menses unlikely will show on blood testing. Options of trial of low-dose estrogen discussed. At this point patients going to try to lose some weight as her BMI is 31 to see if this does not help with her sweating. She will follow up if it continues to be and issue. 2. Pap smear 2012.  Pap/HPV today. History of dysplasia as a teenager with negative Pap smears since then. Assuming this Pap smears normal plan repeat at 3-5 year interval per current screening guidelines. 3. Mammography August 2015. Continue with annual mammography. SBE monthly reviewed. 4. Health maintenance. No routine blood work done a she reports this all done at her primary physician's office. Follow up one year, sooner as needed.     Anastasio Auerbach MD, 4:13 PM 06/30/2014

## 2014-06-30 NOTE — Addendum Note (Signed)
Addended by: Thurnell Garbe A on: 06/30/2014 04:17 PM   Modules accepted: Orders, SmartSet

## 2014-07-02 LAB — URINALYSIS W MICROSCOPIC + REFLEX CULTURE
CRYSTALS: NONE SEEN
Casts: NONE SEEN
Glucose, UA: NEGATIVE mg/dL
Hgb urine dipstick: NEGATIVE
Nitrite: NEGATIVE
Protein, ur: 30 mg/dL — AB
Specific Gravity, Urine: 1.023 (ref 1.005–1.030)
Urobilinogen, UA: 0.2 mg/dL (ref 0.0–1.0)
pH: 6 (ref 5.0–8.0)

## 2014-07-02 LAB — CYTOLOGY - PAP

## 2014-07-03 LAB — URINE CULTURE: Colony Count: 40000

## 2015-03-12 ENCOUNTER — Other Ambulatory Visit: Payer: Self-pay

## 2015-03-12 DIAGNOSIS — Z1231 Encounter for screening mammogram for malignant neoplasm of breast: Secondary | ICD-10-CM

## 2015-04-13 ENCOUNTER — Ambulatory Visit
Admission: RE | Admit: 2015-04-13 | Discharge: 2015-04-13 | Disposition: A | Discharge status: Home / Self Care | Payer: BLUE CROSS/BLUE SHIELD | Source: Ambulatory Visit

## 2015-04-13 DIAGNOSIS — Z1231 Encounter for screening mammogram for malignant neoplasm of breast: Secondary | ICD-10-CM

## 2015-08-05 ENCOUNTER — Encounter: Payer: Self-pay | Admitting: Gynecology

## 2015-08-05 ENCOUNTER — Ambulatory Visit (INDEPENDENT_AMBULATORY_CARE_PROVIDER_SITE_OTHER): Payer: 59 | Admitting: Gynecology

## 2015-08-05 VITALS — BP 120/78 | Ht 63.0 in | Wt 181.0 lb

## 2015-08-05 DIAGNOSIS — R5383 Other fatigue: Secondary | ICD-10-CM

## 2015-08-05 DIAGNOSIS — Z01419 Encounter for gynecological examination (general) (routine) without abnormal findings: Secondary | ICD-10-CM

## 2015-08-05 LAB — CBC WITH DIFFERENTIAL/PLATELET
Basophils Absolute: 0 10*3/uL (ref 0.0–0.1)
Basophils Relative: 0 % (ref 0–1)
EOS ABS: 0.2 10*3/uL (ref 0.0–0.7)
Eosinophils Relative: 3 % (ref 0–5)
HCT: 34.1 % — ABNORMAL LOW (ref 36.0–46.0)
Hemoglobin: 11.3 g/dL — ABNORMAL LOW (ref 12.0–15.0)
LYMPHS ABS: 1.8 10*3/uL (ref 0.7–4.0)
LYMPHS PCT: 35 % (ref 12–46)
MCH: 27.3 pg (ref 26.0–34.0)
MCHC: 33.1 g/dL (ref 30.0–36.0)
MCV: 82.4 fL (ref 78.0–100.0)
MONOS PCT: 10 % (ref 3–12)
MPV: 9.9 fL (ref 8.6–12.4)
Monocytes Absolute: 0.5 10*3/uL (ref 0.1–1.0)
NEUTROS PCT: 52 % (ref 43–77)
Neutro Abs: 2.7 10*3/uL (ref 1.7–7.7)
PLATELETS: 429 10*3/uL — AB (ref 150–400)
RBC: 4.14 MIL/uL (ref 3.87–5.11)
RDW: 15.8 % — ABNORMAL HIGH (ref 11.5–15.5)
WBC: 5.2 10*3/uL (ref 4.0–10.5)

## 2015-08-05 LAB — TSH: TSH: 1.35 u[IU]/mL (ref 0.350–4.500)

## 2015-08-05 NOTE — Patient Instructions (Signed)

## 2015-08-05 NOTE — Progress Notes (Signed)
Marilyn Gibson 1971-02-24 [id number]        44 y.o.  [id number]  for annual exam.  Several issues noted below.  Past medical history,surgical history, problem list, medications, allergies, family history and social history were all reviewed and documented as reviewed in the EPIC chart.  ROS:  Performed with pertinent positives and negatives included in the history, assessment and plan.   Additional significant findings :  none   Exam: Caryn Bee assistant Filed Vitals:   08/05/15 0816  BP: 120/78  Height: 5\' 3"  (1.6 m)  Weight: 181 lb (82.101 kg)   General appearance:  Normal affect, orientation and appearance. Skin: Grossly normal HEENT: Without gross lesions.  No cervical or supraclavicular adenopathy. Thyroid normal.  Lungs:  Clear without wheezing, rales or rhonchi Cardiac: RR, without RMG Abdominal:  Soft, nontender, without masses, guarding, rebound, organomegaly or hernia Breasts:  Examined lying and sitting without masses, retractions, discharge or axillary adenopathy. Pelvic:  Ext/BUS/vagina normal  Cervix normal  Uterus anteverted, normal size, shape and contour, midline and mobile nontender   Adnexa without masses or tenderness    Anus and perineum normal   Rectovaginal normal sphincter tone without palpated masses or tenderness.    Assessment/Plan:  45 y.o. [id number] female for annual exam with regular menses, tubal sterilization.   1. Fatigue. Patient notes being tired but thinks this is probably more situational with work and family. Did have a history of Graves' transiently previously but had normal thyroid functions afterwards. Will go ahead and check TSH and CBC today.  Assuming normal that I think it's situational. 2. Pap smear/HPV normal 2015. No Pap smear done today. No history of significant abnormal Pap smears. 3. Mammography 04/2015. Continue with annual mammography when due. SBE monthly reviewed. 4. Health maintenance. No other routine blood work done as  patients having done through work to include lipids and glucose. Follow up for above lab results otherwise follow up in one year, sooner as needed.   Anastasio Auerbach MD, 8:43 AM 08/05/2015

## 2015-08-06 ENCOUNTER — Other Ambulatory Visit: Payer: Self-pay | Admitting: *Deleted

## 2015-08-06 DIAGNOSIS — D582 Other hemoglobinopathies: Secondary | ICD-10-CM

## 2015-08-06 LAB — URINALYSIS W MICROSCOPIC + REFLEX CULTURE
BILIRUBIN URINE: NEGATIVE
Bacteria, UA: NONE SEEN [HPF]
Casts: NONE SEEN [LPF]
Crystals: NONE SEEN [HPF]
Glucose, UA: NEGATIVE
Hgb urine dipstick: NEGATIVE
KETONES UR: NEGATIVE
LEUKOCYTES UA: NEGATIVE
NITRITE: NEGATIVE
PH: 6.5 (ref 5.0–8.0)
Protein, ur: NEGATIVE
RBC / HPF: NONE SEEN RBC/HPF (ref <=2)
SPECIFIC GRAVITY, URINE: 1.01 (ref 1.001–1.035)
WBC UA: NONE SEEN WBC/HPF (ref <=5)
Yeast: NONE SEEN [HPF]

## 2016-04-07 ENCOUNTER — Other Ambulatory Visit: Payer: Self-pay | Admitting: Gynecology

## 2016-04-07 DIAGNOSIS — Z1231 Encounter for screening mammogram for malignant neoplasm of breast: Secondary | ICD-10-CM

## 2016-04-25 ENCOUNTER — Ambulatory Visit
Admission: RE | Admit: 2016-04-25 | Discharge: 2016-04-25 | Disposition: A | Discharge status: Home / Self Care | Payer: BLUE CROSS/BLUE SHIELD | Source: Ambulatory Visit | Attending: Gynecology | Admitting: Gynecology

## 2016-04-25 DIAGNOSIS — Z1231 Encounter for screening mammogram for malignant neoplasm of breast: Secondary | ICD-10-CM

## 2016-08-30 ENCOUNTER — Encounter: Payer: Self-pay | Admitting: Gynecology

## 2016-08-30 ENCOUNTER — Ambulatory Visit (INDEPENDENT_AMBULATORY_CARE_PROVIDER_SITE_OTHER): Payer: 59 | Admitting: Gynecology

## 2016-08-30 VITALS — BP 122/74 | Ht 63.0 in | Wt 184.0 lb

## 2016-08-30 DIAGNOSIS — R5383 Other fatigue: Secondary | ICD-10-CM

## 2016-08-30 DIAGNOSIS — N9089 Other specified noninflammatory disorders of vulva and perineum: Secondary | ICD-10-CM | POA: Diagnosis not present

## 2016-08-30 DIAGNOSIS — Z01419 Encounter for gynecological examination (general) (routine) without abnormal findings: Secondary | ICD-10-CM | POA: Diagnosis not present

## 2016-08-30 LAB — CBC WITH DIFFERENTIAL/PLATELET
Basophils Absolute: 64 cells/uL (ref 0–200)
Basophils Relative: 1 %
EOS ABS: 128 {cells}/uL (ref 15–500)
Eosinophils Relative: 2 %
HEMATOCRIT: 32.8 % — AB (ref 35.0–45.0)
HEMOGLOBIN: 10.4 g/dL — AB (ref 11.7–15.5)
LYMPHS ABS: 2560 {cells}/uL (ref 850–3900)
Lymphocytes Relative: 40 %
MCH: 25.8 pg — ABNORMAL LOW (ref 27.0–33.0)
MCHC: 31.7 g/dL — ABNORMAL LOW (ref 32.0–36.0)
MCV: 81.4 fL (ref 80.0–100.0)
MPV: 10 fL (ref 7.5–12.5)
Monocytes Absolute: 704 cells/uL (ref 200–950)
Monocytes Relative: 11 %
NEUTROS ABS: 2944 {cells}/uL (ref 1500–7800)
Neutrophils Relative %: 46 %
Platelets: 425 10*3/uL — ABNORMAL HIGH (ref 140–400)
RBC: 4.03 MIL/uL (ref 3.80–5.10)
RDW: 15.8 % — ABNORMAL HIGH (ref 11.0–15.0)
WBC: 6.4 10*3/uL (ref 3.8–10.8)

## 2016-08-30 LAB — TSH: TSH: 1.41 mIU/L

## 2016-08-30 NOTE — Patient Instructions (Signed)

## 2016-08-30 NOTE — Progress Notes (Signed)
    Marilyn Gibson 12-30-1970 [id number]        46 y.o.  [id number] for annual exam.   Past medical history,surgical history, problem list, medications, allergies, family history and social history were all reviewed and documented as reviewed in the EPIC chart.  ROS:  Performed with pertinent positives and negatives included in the history, assessment and plan.   Additional significant findings :  None   Exam: Caryn Bee assistant Vitals:   08/30/16 1528  BP: 122/74  Weight: 184 lb (83.5 kg)  Height: 5\' 3"  (1.6 m)   Body mass index is 32.59 kg/m.  General appearance:  Normal affect, orientation and appearance. Skin: Grossly normal HEENT: Without gross lesions.  No cervical or supraclavicular adenopathy. Thyroid normal.  Lungs:  Clear without wheezing, rales or rhonchi Cardiac: RR, without RMG Abdominal:  Soft, nontender, without masses, guarding, rebound, organomegaly or hernia Breasts:  Examined lying and sitting without masses, retractions, discharge or axillary adenopathy. Pelvic:  Ext, BUS, Vagina: With classic appearing skin tag left perineal/gluteal region.  Cervix: Normal  Uterus: Anteverted, normal size, shape and contour, midline and mobile nontender   Adnexa: Without masses or tenderness    Anus and perineum: Normal   Rectovaginal: Normal sphincter tone without palpated masses or tenderness.    Assessment/Plan:  46 y.o. [id number] female for annual exam with regular menses, tubal sterilization.   1. Complaints of fatigue. Had similar complaints last year. We'll check TSH CBC and vitamin D at her request. 2. Skin tag left perineum. Bothersome to the patient. Wants to have excised and she is going to schedule an appointment to do so. 3. Menses heavy every second to third menses. Light in between. No intermenstrual bleeding or prolonged bleeding. Options to include hormonal manipulation, Mirena IUD, Lysteda, endometrial ablation discussed. At this point patient wants no  intervention. We'll monitor and if consistently heavy restarts having more prolonged or intermenstrual bleeding will follow up for further evaluation and will consider sonohysterogram. 4. Mammography 04/2016. Continue with annual mammography when due. SBE monthly reviewed. 5. Pap smear/HPV 06/2014. No Pap smear done today. No history of significant abnormal Pap smears. Plan repeat Pap smear at 5 year interval per current screening guidelines. 6. Health maintenance. No routine lab work done as patient reports this done through work. Follow up to have the skin tag removed otherwise follow up 1 year, sooner as needed.   Anastasio Auerbach MD, 3:52 PM 08/30/2016

## 2016-08-31 ENCOUNTER — Other Ambulatory Visit: Payer: Self-pay | Admitting: Gynecology

## 2016-08-31 DIAGNOSIS — E559 Vitamin D deficiency, unspecified: Secondary | ICD-10-CM

## 2016-08-31 DIAGNOSIS — D500 Iron deficiency anemia secondary to blood loss (chronic): Secondary | ICD-10-CM

## 2016-08-31 LAB — VITAMIN D 25 HYDROXY (VIT D DEFICIENCY, FRACTURES): Vit D, 25-Hydroxy: 16 ng/mL — ABNORMAL LOW (ref 30–100)

## 2016-12-04 ENCOUNTER — Other Ambulatory Visit: Payer: Self-pay

## 2017-04-18 ENCOUNTER — Other Ambulatory Visit: Payer: Self-pay | Admitting: Gynecology

## 2017-04-18 DIAGNOSIS — Z1231 Encounter for screening mammogram for malignant neoplasm of breast: Secondary | ICD-10-CM

## 2017-05-08 ENCOUNTER — Ambulatory Visit: Payer: Self-pay

## 2017-05-30 ENCOUNTER — Ambulatory Visit: Payer: Self-pay

## 2017-06-25 ENCOUNTER — Ambulatory Visit: Payer: Self-pay

## 2017-07-19 ENCOUNTER — Ambulatory Visit
Admission: RE | Admit: 2017-07-19 | Discharge: 2017-07-19 | Disposition: A | Discharge status: Home / Self Care | Payer: 59 | Source: Ambulatory Visit | Attending: Gynecology | Admitting: Gynecology

## 2017-07-19 DIAGNOSIS — Z1231 Encounter for screening mammogram for malignant neoplasm of breast: Secondary | ICD-10-CM

## 2017-10-30 ENCOUNTER — Encounter: Payer: 59 | Admitting: Gynecology

## 2017-11-05 ENCOUNTER — Ambulatory Visit (INDEPENDENT_AMBULATORY_CARE_PROVIDER_SITE_OTHER): Payer: 59 | Admitting: Gynecology

## 2017-11-05 ENCOUNTER — Encounter: Payer: Self-pay | Admitting: Gynecology

## 2017-11-05 VITALS — BP 130/84 | Ht 63.0 in | Wt 185.0 lb

## 2017-11-05 DIAGNOSIS — N924 Excessive bleeding in the premenopausal period: Secondary | ICD-10-CM

## 2017-11-05 DIAGNOSIS — Z01419 Encounter for gynecological examination (general) (routine) without abnormal findings: Secondary | ICD-10-CM | POA: Diagnosis not present

## 2017-11-05 DIAGNOSIS — Z1321 Encounter for screening for nutritional disorder: Secondary | ICD-10-CM

## 2017-11-05 NOTE — Progress Notes (Addendum)
    Marilyn Gibson [birth year] [id number]        46 y.o.  [id number] for annual gynecologic exam.  Also notes that her periods seem to be getting heavier and lasting longer.  Occurring monthly without intermenstrual bleeding.  Lasting 7 days with bleedthrough episodes.  Past medical history,surgical history, problem list, medications, allergies, family history and social history were all reviewed and documented as reviewed in the EPIC chart.  ROS:  Performed with pertinent positives and negatives included in the history, assessment and plan.   Additional significant findings : None   Exam: Caryn Bee assistant Vitals:   11/05/17 1107  BP: 130/84  Weight: 185 lb (83.9 kg)  Height: 5\' 3"  (1.6 m)   Body mass index is 32.77 kg/m.  General appearance:  Normal affect, orientation and appearance. Skin: Grossly normal HEENT: Without gross lesions.  No cervical or supraclavicular adenopathy. Thyroid normal.  Lungs:  Clear without wheezing, rales or rhonchi Cardiac: RR, without RMG Abdominal:  Soft, nontender, without masses, guarding, rebound, organomegaly or hernia Breasts:  Examined lying and sitting without masses, retractions, discharge or axillary adenopathy. Pelvic:  Ext, BUS, Vagina: Normal.  With classic skin tag left perineal/gluteal area.    Cervix: Normal  Uterus: Anteverted, normal size, shape and contour, midline and mobile nontender   Adnexa: Without masses or tenderness    Anus and perineum: Normal   Rectovaginal: Normal sphincter tone without palpated masses or tenderness.    Assessment/Plan:  47 y.o. [id number] female for annual gynecologic exam with regular menses, tubal sterilization.   1. Menorrhagia.  Patient's menses are heavier and lasting longer.  No intermenstrual bleeding.  Will check baseline CBC and order sonohysterogram to rule out nonpalpable abnormalities and intracavitary defects.  Differential reviewed with the patient to include physiologic, hormonal  dysfunction, adenomyosis, leiomyoma, endometrial defects to include polyps all reviewed.  She will follow-up for the sonohysterogram and then will go from there.  We will also check baseline TSH. 2. Skin tag left perineum.  Stable on serial exams.  Benign in appearance.  Again the patient states she would like to have it excised but does not want to schedule it.  She will follow-up if she decides to have it removed. 3. Mammography 07/2017.  Continue with annual mammography when due.  Breast exam normal today. 4. Pap smear/HPV 06/2014.  No Pap smear done today.  No history of abnormal Pap smears.  Plan repeat Pap smear next year at 5-year interval per current screening guidelines. 5. Health maintenance.  She requests glucose check.  Also asked to have vitamin D level checked as she has been vitamin D deficient in the past.  Has routine blood work done through her primary physician's office.  Glucose/hemoglobin A1c ordered.  Follow-up for sonohysterogram.  Follow-up in 1 year for annual exam.   Anastasio Auerbach MD, 11:38 AM 11/05/2017

## 2017-11-05 NOTE — Patient Instructions (Signed)
Follow up for ultrasound as scheduled 

## 2017-11-06 LAB — HEMOGLOBIN A1C
EAG (MMOL/L): 6.2 (calc)
HEMOGLOBIN A1C: 5.5 %{Hb} (ref <5.7)
MEAN PLASMA GLUCOSE: 111 (calc)

## 2017-11-06 LAB — CBC WITH DIFFERENTIAL/PLATELET
BASOS ABS: 40 {cells}/uL (ref 0–200)
Basophils Relative: 0.7 %
EOS ABS: 137 {cells}/uL (ref 15–500)
Eosinophils Relative: 2.4 %
HCT: 38.4 % (ref 35.0–45.0)
Hemoglobin: 13 g/dL (ref 11.7–15.5)
Lymphs Abs: 2217 cells/uL (ref 850–3900)
MCH: 29.4 pg (ref 27.0–33.0)
MCHC: 33.9 g/dL (ref 32.0–36.0)
MCV: 86.9 fL (ref 80.0–100.0)
MONOS PCT: 10.3 %
MPV: 10.8 fL (ref 7.5–12.5)
Neutro Abs: 2719 cells/uL (ref 1500–7800)
Neutrophils Relative %: 47.7 %
Platelets: 418 10*3/uL — ABNORMAL HIGH (ref 140–400)
RBC: 4.42 10*6/uL (ref 3.80–5.10)
RDW: 13 % (ref 11.0–15.0)
Total Lymphocyte: 38.9 %
WBC: 5.7 10*3/uL (ref 3.8–10.8)
WBCMIX: 587 {cells}/uL (ref 200–950)

## 2017-11-06 LAB — GLUCOSE, RANDOM: GLUCOSE: 86 mg/dL (ref 65–99)

## 2017-11-06 LAB — TSH: TSH: 1.26 m[IU]/L

## 2017-11-06 LAB — VITAMIN D 25 HYDROXY (VIT D DEFICIENCY, FRACTURES): VIT D 25 HYDROXY: 45 ng/mL (ref 30–100)

## 2018-08-05 ENCOUNTER — Other Ambulatory Visit: Payer: Self-pay | Admitting: Gynecology

## 2018-08-05 DIAGNOSIS — Z1231 Encounter for screening mammogram for malignant neoplasm of breast: Secondary | ICD-10-CM

## 2018-08-30 ENCOUNTER — Encounter: Payer: Self-pay | Admitting: Gynecology

## 2018-08-30 ENCOUNTER — Ambulatory Visit: Payer: Managed Care, Other (non HMO) | Admitting: Gynecology

## 2018-08-30 VITALS — BP 130/80

## 2018-08-30 DIAGNOSIS — N921 Excessive and frequent menstruation with irregular cycle: Secondary | ICD-10-CM | POA: Diagnosis not present

## 2018-08-30 MED ORDER — MEGESTROL ACETATE 20 MG PO TABS: 20.0000 mg | ORAL_TABLET | Freq: Every day | ORAL | 0 refills | Status: DC

## 2018-08-30 NOTE — Patient Instructions (Signed)
Follow-up for the ultrasound as scheduled. 

## 2018-08-30 NOTE — Progress Notes (Signed)
    Marilyn Gibson 1971-03-21 [id number]        47 y.o.  [id number] presents complaining of heavy irregular menses.  I saw her in May 2019 where she complained of heavier menses and we ordered a sonohysterogram.  The patient failed to follow-up for this.  She notes over the past year her menses have progressively gotten heavier with now she is having bleedthrough episodes and bleeding in between her menses.  Not having significant pain with her bleeding.  No significant menopausal symptoms.  Past medical history,surgical history, problem list, medications, allergies, family history and social history were all reviewed and documented in the EPIC chart.  Directed ROS with pertinent positives and negatives documented in the history of present illness/assessment and plan.  Exam: Caryn Bee assistant Vitals:   08/30/18 1436  BP: 130/80   General appearance:  Normal Abdomen soft nontender without masses guarding rebound Pelvic external BUS vagina with light menses flow.  Cervix normal.  Uterus grossly normal midline mobile nontender.  Adnexa without masses or tenderness.  Assessment/Plan:  48 y.o. [id number] with heavy irregular menses.  Check baseline CBC, FSH and TSH.  Schedule sonohysterogram as previously recommended.  We again discussed possibilities to include hormonal, structural such as polyps or myomas.  Patient agrees to schedule the sonohysterogram.  Megace 20 mg twice daily times several days then daily prescribed #30 to help ease bleeding until ultrasound.  Patient is status post tubal sterilization and denies pregnancy possibilities.    Anastasio Auerbach MD, 2:53 PM 08/30/2018

## 2018-08-31 LAB — CBC WITH DIFFERENTIAL/PLATELET
BASOS ABS: 0 10*3/uL (ref 0.0–0.2)
Basos: 1 %
EOS (ABSOLUTE): 0 10*3/uL (ref 0.0–0.4)
Eos: 1 %
Hematocrit: 36.3 % (ref 34.0–46.6)
Hemoglobin: 12.4 g/dL (ref 11.1–15.9)
IMMATURE GRANULOCYTES: 0 %
Immature Grans (Abs): 0 10*3/uL (ref 0.0–0.1)
LYMPHS: 37 %
Lymphocytes Absolute: 2.1 10*3/uL (ref 0.7–3.1)
MCH: 30 pg (ref 26.6–33.0)
MCHC: 34.2 g/dL (ref 31.5–35.7)
MCV: 88 fL (ref 79–97)
Monocytes Absolute: 0.5 10*3/uL (ref 0.1–0.9)
Monocytes: 9 %
Neutrophils Absolute: 3 10*3/uL (ref 1.4–7.0)
Neutrophils: 52 %
PLATELETS: 395 10*3/uL (ref 150–450)
RBC: 4.14 x10E6/uL (ref 3.77–5.28)
RDW: 13.7 % (ref 11.7–15.4)
WBC: 5.7 10*3/uL (ref 3.4–10.8)

## 2018-08-31 LAB — TSH: TSH: 0.853 u[IU]/mL (ref 0.450–4.500)

## 2018-08-31 LAB — FOLLICLE STIMULATING HORMONE: FSH: 13.7 m[IU]/mL

## 2018-09-04 ENCOUNTER — Other Ambulatory Visit: Payer: Self-pay | Admitting: Gynecology

## 2018-09-04 DIAGNOSIS — N939 Abnormal uterine and vaginal bleeding, unspecified: Secondary | ICD-10-CM

## 2018-09-06 ENCOUNTER — Ambulatory Visit
Admission: RE | Admit: 2018-09-06 | Discharge: 2018-09-06 | Disposition: A | Discharge status: Home / Self Care | Payer: Managed Care, Other (non HMO) | Source: Ambulatory Visit | Attending: Gynecology | Admitting: Gynecology

## 2018-09-06 DIAGNOSIS — Z1231 Encounter for screening mammogram for malignant neoplasm of breast: Secondary | ICD-10-CM

## 2018-09-11 ENCOUNTER — Ambulatory Visit: Payer: Managed Care, Other (non HMO) | Admitting: Gynecology

## 2018-09-11 ENCOUNTER — Ambulatory Visit (INDEPENDENT_AMBULATORY_CARE_PROVIDER_SITE_OTHER): Payer: Managed Care, Other (non HMO)

## 2018-09-11 ENCOUNTER — Other Ambulatory Visit: Payer: Self-pay

## 2018-09-11 ENCOUNTER — Other Ambulatory Visit: Payer: Self-pay | Admitting: Gynecology

## 2018-09-11 ENCOUNTER — Encounter: Payer: Self-pay | Admitting: Gynecology

## 2018-09-11 VITALS — BP 122/76

## 2018-09-11 DIAGNOSIS — N9489 Other specified conditions associated with female genital organs and menstrual cycle: Secondary | ICD-10-CM | POA: Diagnosis not present

## 2018-09-11 DIAGNOSIS — N921 Excessive and frequent menstruation with irregular cycle: Secondary | ICD-10-CM | POA: Diagnosis not present

## 2018-09-11 DIAGNOSIS — N840 Polyp of corpus uteri: Secondary | ICD-10-CM

## 2018-09-11 DIAGNOSIS — N939 Abnormal uterine and vaginal bleeding, unspecified: Secondary | ICD-10-CM | POA: Diagnosis not present

## 2018-09-11 MED ORDER — MEGESTROL ACETATE 20 MG PO TABS: 20.0000 mg | ORAL_TABLET | Freq: Two times a day (BID) | ORAL | 0 refills | Status: DC

## 2018-09-11 NOTE — Patient Instructions (Signed)
Office will call to schedule the surgery

## 2018-09-11 NOTE — Progress Notes (Signed)
    Marilyn Gibson Dec 22, 1970 [id number]        47 y.o.  [id number] presents for sonohysterogram.  History of irregular bleeding and heavy bleeding for the past 1 to 2 years..  Past medical history,surgical history, problem list, medications, allergies, family history and social history were all reviewed and documented in the EPIC chart.  Directed ROS with pertinent positives and negatives documented in the history of present illness/assessment and plan.  Exam: [place] assistant General appearance:  Normal Abdomen soft nontender without masses guarding rebound Pelvic external BUS vagina normal.  Cervix normal.  Uterus grossly normal size midline mobile nontender.  Adnexa without masses or tenderness.  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  Endometrial echo 14.3 mm with focal defect noted measuring 19 x 14 mm.  Right ovary with thin-walled internal low-level echo cysts measuring 20 x 21 mm and 14 x 11 mm with negative color flow Doppler.  Left ovary normal.  Cul-de-sac negative  Sonohysterogram performed, sterile technique, easy catheter introduction, good distention with 20 x 14 mm intracavitary defect consistent with polyp/possible submucous myoma.  Endometrial biopsy taken.  Patient tolerated well.  Assessment/Plan:  48 y.o. [id number] with heavy or irregular bleeding and ultrasound showing intracavitary defect consistent with large polyp or submucous myoma.  Reviewed situation with the patient my recommendations to proceed with hysteroscopy D&C with resection of this defect.  I reviewed what is involved with the procedure to include instrumentation the expected intraoperative and postoperative courses.  I discussed the risks to include infection, antibiotics, hemorrhage necessitating transfusion, damage to internal organs including vagina cervix and uterus with perforation damage to internal organs such as bowel  bladder ureters vessels and nerves necessitating major exploratory reparative surgeries in future reparative surgeries including ostomy formation was discussed.  The patient agrees with proceeding with the surgery and will schedule at her convenience.  Follow-up for the endometrial biopsy results also.    Anastasio Auerbach MD, 11:51 AM 09/11/2018

## 2018-09-18 ENCOUNTER — Telehealth: Payer: Self-pay | Admitting: *Deleted

## 2018-09-18 DIAGNOSIS — N921 Excessive and frequent menstruation with irregular cycle: Secondary | ICD-10-CM

## 2018-09-18 NOTE — Telephone Encounter (Signed)
Pt called wanting to set up surgery.  I advised that due to the current COVID-19 crisis we are not scheduling non-urgent surgical cases for 4 weeks.  I advised her she does not meet criteria for an urgent case.  I told her I would call her back in 2 weeks to schedule her or to advise her if the OR situation has changed.  The patient states she is taking Megace 20mg  2-3 times daily and is still bleeding like a period. Her Hgb at last check end of February was 12.4. She asks if there is anything else she can take to help with bleeding.  Please advise.  Sharrie Rothman CMA

## 2018-09-18 NOTE — Telephone Encounter (Signed)
Continuing Megace at 20 mg 3 times daily for now.  If she would continue to bleed heavily then I would recommend repeating the CBC in 1 to 2 weeks for stability check.

## 2018-09-19 MED ORDER — NORETHINDRONE ACET-ETHINYL EST 1-20 MG-MCG PO TABS: 1.0000 | ORAL_TABLET | Freq: Every day | ORAL | 3 refills | Status: DC

## 2018-09-19 NOTE — Telephone Encounter (Signed)
She could try low-dose oral contraceptives like Loestrin 1/20 equivalent.  Main risk is blood clots like stroke heart attack deep venous thromboses.  Risk is low and otherwise healthy non-smoker.  She does have a history of migraines and sometimes birth control pills can exacerbate migraines which she would have to monitor.  If she would like to try this then she could stop the Megace and start on Loestrin 1/20 equivalent.

## 2018-09-19 NOTE — Telephone Encounter (Signed)
Pt informed of all information regarding birth control prescriptions. Went over precautions given by Dr Phineas Real. Pt understood them. Lab apt for 3/26 for CBC. Rx sent to pharmacy. KW CMA

## 2018-09-19 NOTE — Telephone Encounter (Signed)
I informed patient of staying on Megace 20mg  3xd and she will come next week for CBC.  She did mention that her sister has myomas and has menorrhagia like she does. Marilyn Gibson stated that her sister uses birth control pills for bleeding control. Marilyn Gibson is asking if that is a possibility to just try now since non urgent surgery are not being scheduled until after May 4th. Please advise. Sharrie Rothman CMA

## 2018-09-26 ENCOUNTER — Other Ambulatory Visit: Payer: Managed Care, Other (non HMO)

## 2018-09-26 ENCOUNTER — Other Ambulatory Visit: Payer: Self-pay

## 2018-09-26 ENCOUNTER — Other Ambulatory Visit: Payer: Self-pay | Admitting: Gynecology

## 2018-09-26 DIAGNOSIS — N921 Excessive and frequent menstruation with irregular cycle: Secondary | ICD-10-CM

## 2018-09-27 ENCOUNTER — Encounter: Payer: Self-pay | Admitting: Gynecology

## 2018-09-27 LAB — CBC WITH DIFFERENTIAL/PLATELET
Basophils Absolute: 0 10*3/uL (ref 0.0–0.2)
Basos: 1 %
EOS (ABSOLUTE): 0.2 10*3/uL (ref 0.0–0.4)
EOS: 3 %
Hematocrit: 38.7 % (ref 34.0–46.6)
Hemoglobin: 12.5 g/dL (ref 11.1–15.9)
IMMATURE GRANS (ABS): 0 10*3/uL (ref 0.0–0.1)
Immature Granulocytes: 0 %
Lymphocytes Absolute: 1.7 10*3/uL (ref 0.7–3.1)
Lymphs: 31 %
MCH: 28.6 pg (ref 26.6–33.0)
MCHC: 32.3 g/dL (ref 31.5–35.7)
MCV: 89 fL (ref 79–97)
Monocytes Absolute: 0.6 10*3/uL (ref 0.1–0.9)
Monocytes: 10 %
Neutrophils Absolute: 3.1 10*3/uL (ref 1.4–7.0)
Neutrophils: 55 %
Platelets: 401 10*3/uL (ref 150–450)
RBC: 4.37 x10E6/uL (ref 3.77–5.28)
RDW: 13.9 % (ref 11.7–15.4)
WBC: 5.6 10*3/uL (ref 3.4–10.8)

## 2018-10-01 ENCOUNTER — Telehealth: Payer: Self-pay | Admitting: *Deleted

## 2018-10-01 ENCOUNTER — Ambulatory Visit: Payer: Managed Care, Other (non HMO) | Admitting: Gynecology

## 2018-10-01 ENCOUNTER — Encounter: Payer: Self-pay | Admitting: Gynecology

## 2018-10-01 ENCOUNTER — Other Ambulatory Visit: Payer: Self-pay

## 2018-10-01 VITALS — BP 118/78

## 2018-10-01 DIAGNOSIS — N939 Abnormal uterine and vaginal bleeding, unspecified: Secondary | ICD-10-CM | POA: Diagnosis not present

## 2018-10-01 NOTE — Progress Notes (Signed)
    Marilyn Gibson 07-24-1970 [id number]        48 y.o.  [id number] presents complaining of passage of a large clot with heavy bleeding.  She has a history of irregular heavy bleeding.  Sonohysterogram showed thickened endometrial echo with 20 x 14 mm intracavitary defect consistent with polyp/submucous myoma.  She is awaiting hysteroscopy D&C which is temporarily postponed due to the coronavirus limiting OR availability.  Was started on Megace but continued to have some bleeding on this and at her request was switched to low-dose oral contraceptive.  She is been on this several weeks.  Has had some bleeding on and off but then passed a large clot last night.  Past medical history,surgical history, problem list, medications, allergies, family history and social history were all reviewed and documented in the EPIC chart.  Directed ROS with pertinent positives and negatives documented in the history of present illness/assessment and plan.  Exam: Caryn Bee assistant Vitals:   10/01/18 1133  BP: 118/78   General appearance:  Normal Abdomen soft nontender without masses guarding rebound. Pelvic external BUS vagina with scant menstrual bleeding.  Cervix normal.  Uterus grossly normal midline mobile nontender.  Adnexa without masses or tenderness  Assessment/Plan:  48 y.o. [id number] with submucous myoma/polyp and intermittent heavy bleeding.  Not bleeding heavy now.  Options to go back to Megace alone or continuing on the low-dose oral contraceptives for now and see how she does discussed.  As soon as the operating room becomes available we will schedule her D&C.  At this point she would like to continue on the low-dose oral contraceptives and will call if she has heavy bleeding.    Anastasio Auerbach MD, 11:46 AM 10/01/2018

## 2018-10-01 NOTE — Patient Instructions (Signed)
Call if heavy bleeding continues/recurs

## 2018-10-01 NOTE — Telephone Encounter (Signed)
Patient called to update, she stated bleeding heavy 2-3 days ago wearing pads changing 2-3 pad per hour, stopped megace as directed, started Junel 1/20 mcg tablets a couple of weeks ago.  She also notes last night passing a small grapefruit size clot, also notes lower pelvic cramping. Patient report flow has never been this heavy.  Due to increased bleeding , scheduled to come in office today at 11:30am. All COVID-19 questions asked.

## 2018-10-09 ENCOUNTER — Encounter: Payer: Self-pay | Admitting: *Deleted

## 2018-10-14 ENCOUNTER — Encounter: Payer: Self-pay | Admitting: *Deleted

## 2018-10-14 NOTE — Progress Notes (Signed)
Mailed letter to patient as she has not read the MyChart message sent last week. KW CMA

## 2018-11-04 ENCOUNTER — Telehealth: Payer: Self-pay

## 2018-11-04 NOTE — Telephone Encounter (Signed)
I called patient and spoke with her about ins benefits and her estimated surgery prepymt amount due. I will mail her a Ambulance person.    She knows I will contact her as soon as we began scheduling surgery.

## 2018-11-06 ENCOUNTER — Telehealth: Payer: Self-pay

## 2018-11-06 ENCOUNTER — Other Ambulatory Visit: Payer: Self-pay

## 2018-11-06 MED ORDER — MISOPROSTOL 100 MCG PO TABS: ORAL_TABLET | ORAL | 0 refills | Status: DC

## 2018-11-06 NOTE — Telephone Encounter (Signed)
Called patient and let her know we are now scheduling surgery.  I offered her 11/15/18 and she was happy with that. Scheduled her for 7:30am that day. I have already discussed finances previously and letter has been mailed.   SHe has CE scheduled this Thursday and we have added time for pre op appt to that.  I discussed with her the need for Cytotec tab vaginally hs before surgery and also advised may see some light bleeding/spotting/cramping with it but only means it is working. No worries.  Rx sent.

## 2018-11-07 ENCOUNTER — Ambulatory Visit (INDEPENDENT_AMBULATORY_CARE_PROVIDER_SITE_OTHER): Payer: Managed Care, Other (non HMO) | Admitting: Gynecology

## 2018-11-07 ENCOUNTER — Encounter: Payer: Self-pay | Admitting: Gynecology

## 2018-11-07 VITALS — BP 124/80 | Ht 62.0 in | Wt 151.0 lb

## 2018-11-07 DIAGNOSIS — Z01419 Encounter for gynecological examination (general) (routine) without abnormal findings: Secondary | ICD-10-CM | POA: Diagnosis not present

## 2018-11-07 DIAGNOSIS — Z1151 Encounter for screening for human papillomavirus (HPV): Secondary | ICD-10-CM | POA: Diagnosis not present

## 2018-11-07 DIAGNOSIS — N921 Excessive and frequent menstruation with irregular cycle: Secondary | ICD-10-CM

## 2018-11-07 DIAGNOSIS — N840 Polyp of corpus uteri: Secondary | ICD-10-CM

## 2018-11-07 NOTE — Addendum Note (Signed)
Addended by: Nelva Nay on: 11/07/2018 09:32 AM   Modules accepted: Orders

## 2018-11-07 NOTE — H&P (Signed)
Marilyn Gibson August 25, 1970 [id number]   History and Physical  Chief complaint: Heavy irregular menses, endometrial polyp  History of present illness: 48 y.o. [id number] with a history of heavy irregular menses.  Underwent sonohysterogram which showed a large endometrial defect measuring 20 x 14 mm consistent with a polyp or possible submucous myoma.  Endometrial sampling showed endocervical polyp but no endometrial tissue.  The patient is admitted for hysteroscopy D&C with resection of the endometrial defect.  Past Medical History:  Diagnosis Date  . Anemia   . Asthma   . Bronchitis   . Cervical dysplasia    as teenager, normal since  . GERD (gastroesophageal reflux disease)   . Grave's disease    dx w/ 1st child 2002 but then after delivery, graves dis went away  . Headache(784.0)    tx w lexapro  . Heart murmur    as a child, no problem as adult  . History of migraines   . Hyperlipidemia    no meds  . Hypertension   . Seasonal allergies     Past Surgical History:  Procedure Laterality Date  . BILATERAL SALPINGECTOMY  07/04/2012   Procedure: BILATERAL SALPINGECTOMY;  Surgeon: Anastasio Auerbach, MD;  Location: [facility];  Service: Gynecology;  Laterality: Bilateral;  . BREAST BIOPSY Right 2014  . BREAST EXCISIONAL BIOPSY Right 2014  . BREAST SURGERY     Benign breast lump excised  . dilation and evacution  02/2004   loss at 19 weeks, mom pos. for parvovirus  . LAPAROSCOPY  07/04/2012   Procedure: LAPAROSCOPY OPERATIVE;  Surgeon: Anastasio Auerbach, MD;  Location: [facility];  Service: Gynecology;  Laterality: N/A;  . Moles removed    . WISDOM TOOTH EXTRACTION      Family History  Problem Relation Age of Onset  . Cancer Other        colon  . Hypertension Mother   . Diabetes Father   . Cancer Father        Lymphoma    Social History:  reports that she has never smoked. She has never used smokeless tobacco. She reports that she does not drink alcohol or use drugs.  Allergies:   Allergies  Allergen Reactions  . Penicillins Hives and Swelling    No swelling of throat/tongue    Medications: See Epic for the most current list of medications.  ROS:  Was performed and pertinent positives and negatives are included in the history of present illness.  Exam:  Caryn Bee assistant    Vitals:   11/07/18 0827  BP: 124/80  Weight: 151 lb (68.5 kg)  Height: 5\' 2"  (1.575 m)   Body mass index is 27.62 kg/m.  General appearance:  Normal affect, orientation and appearance. Skin: Grossly normal HEENT: Without gross lesions.  No cervical or supraclavicular adenopathy. Thyroid normal.  Lungs:  Clear without wheezing, rales or rhonchi Cardiac: RR, without RMG Abdominal:  Soft, nontender, without masses, guarding, rebound, organomegaly or hernia Breasts:  Examined lying and sitting without masses, retractions, discharge or axillary adenopathy. Pelvic:             Ext, BUS, Vagina: Normal.  Small classic skin papule left perineal/gluteal region stable over years observation.             Cervix: Normal.  Pap smear/HPV             Uterus: Anteverted, normal size, shape and contour, midline and mobile nontender  Adnexa: Without masses or tenderness               Anus and perineum: Normal              Rectovaginal: Normal sphincter tone without palpated masses or tenderness.     Assessment/Plan:  48 y.o. [id number] with history of heavy menses with irregular bleeding in between.  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  Endometrial echo was 14 mm with sonohysterogram showing a large endometrial polyp/possible submucous myoma measuring 20 x 14 mm.  Endometrial biopsy showed endocervical polyp but no endometrial tissue.  Patient for scheduled hysteroscopy D&C with resection of the endometrial defect next week.  I reviewed the proposed surgery with the patient to include the expected intraoperative and postoperative courses as well as the  recovery period. The use of the hysteroscope, resectoscope and the D&C portion were all discussed. The risks of surgery to include infection, prolonged antibiotics, hemorrhage necessitating transfusion and the risks of transfusion, including transfusion reaction, hepatitis, HIV, mad cow disease and other unknown entities were all discussed understood and accepted. The risk of damage to internal organs during the procedure, either immediately recognized or delay recognized, including vagina, cervix, uterus, possible perforation causing damage to bowel, bladder, ureters, vessels and nerves necessitating major exploratory reparative surgery and future reparative surgeries including bladder repair, ureteral damage repair, bowel resection, ostomy formation was also discussed understood and accepted.  She also understands there are no guarantees that this will eliminate her heavy irregular bleeding.  It may continue, worsen or change following the procedure.  The patient's questions were answered to her satisfaction and she is ready to proceed with surgery.    Anastasio Auerbach MD, 9:03 AM 11/07/2018

## 2018-11-07 NOTE — Progress Notes (Addendum)
° ° °  Marilyn Gibson [birth year] [id number]        47 y.o.  [id number] for annual gynecologic exam.  Also for preoperative visit for upcoming hysteroscopy D&C due to her menorrhagia and endometrial polyp on sonohysterogram.  Past medical history,surgical history, problem list, medications, allergies, family history and social history were all reviewed and documented as reviewed in the EPIC chart.  ROS:  Performed with pertinent positives and negatives included in the history, assessment and plan.   Additional significant findings : None   Exam: Caryn Bee assistant Vitals:   11/07/18 0827  BP: 124/80  Weight: 151 lb (68.5 kg)  Height: 5\' 2"  (1.575 m)   Body mass index is 27.62 kg/m.  General appearance:  Normal affect, orientation and appearance. Skin: Grossly normal HEENT: Without gross lesions.  No cervical or supraclavicular adenopathy. Thyroid normal.  Lungs:  Clear without wheezing, rales or rhonchi Cardiac: RR, without RMG Abdominal:  Soft, nontender, without masses, guarding, rebound, organomegaly or hernia Breasts:  Examined lying and sitting without masses, retractions, discharge or axillary adenopathy. Pelvic:  Ext, BUS, Vagina: Normal.  Small classic skin papule left perineal/gluteal region stable over years observation.  Cervix: Normal.  Pap smear/HPV  Uterus: Anteverted, normal size, shape and contour, midline and mobile nontender   Adnexa: Without masses or tenderness    Anus and perineum: Normal   Rectovaginal: Normal sphincter tone without palpated masses or tenderness.    Assessment/Plan:  48 y.o. [id number] female for annual gynecologic exam.  With irregular menses, tubal sterilization  1. Menorrhagia, irregular menses, endometrial polyp.  Patient with history of heavy menses with irregular bleeding in between.  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  Endometrial echo was 14 mm with sonohysterogram showing a large endometrial polyp/possible  submucous myoma measuring 20 x 14 mm.  Endometrial biopsy showed endocervical polyp but no endometrial tissue.  Patient for scheduled hysteroscopy D&C with resection of the endometrial defect next week.  I reviewed the proposed surgery with the patient to include the expected intraoperative and postoperative courses as well as the recovery period. The use of the hysteroscope, resectoscope and the D&C portion were all discussed. The risks of surgery to include infection, prolonged antibiotics, hemorrhage necessitating transfusion and the risks of transfusion, including transfusion reaction, hepatitis, HIV, mad cow disease and other unknown entities were all discussed understood and accepted. The risk of damage to internal organs during the procedure, either immediately recognized or delay recognized, including vagina, cervix, uterus, possible perforation causing damage to bowel, bladder, ureters, vessels and nerves necessitating major exploratory reparative surgery and future reparative surgeries including bladder repair, ureteral damage repair, bowel resection, ostomy formation was also discussed understood and accepted.  She also understands there are no guarantees that this will eliminate her heavy irregular bleeding.  It may continue, worsen or change following the procedure.  The patient's questions were answered to her satisfaction and she is ready to proceed with surgery. 2. Pap smear/HPV 06/2014.  Pap smear/HPV today.  No history of abnormal Pap smears previously. 3. Mammography 09/2018.  Continue with annual mammography next year when due.  Breast exam normal today. 4. Health maintenance.  No routine lab work done as patient is followed by her primary physician as well as having recent blood work done for her upcoming surgery.  Follow-up for surgery as scheduled.  Follow-up in 1 year for annual exam.   Anastasio Auerbach MD, 8:55 AM 11/07/2018

## 2018-11-07 NOTE — Patient Instructions (Signed)
Followup for surgery as scheduled. 

## 2018-11-08 ENCOUNTER — Encounter (HOSPITAL_BASED_OUTPATIENT_CLINIC_OR_DEPARTMENT_OTHER): Payer: Self-pay

## 2018-11-08 ENCOUNTER — Other Ambulatory Visit: Payer: Self-pay

## 2018-11-08 LAB — PAP IG AND HPV HIGH-RISK: HPV DNA High Risk: NOT DETECTED

## 2018-11-08 NOTE — Progress Notes (Signed)
SPOKE W/  Marilyn Gibson     SCREENING SYMPTOMS OF COVID 19:   COUGH--NO  RUNNY NOSE--- NO  SORE THROAT---NO  NASAL CONGESTION----NO  SNEEZING----NO  SHORTNESS OF BREATH---NO  DIFFICULTY BREATHING---NO  TEMP >100.0 -----NO  UNEXPLAINED BODY ACHES------NO  CHILLS -------- NO  HEADACHES ---------NO  LOSS OF SMELL/ TASTE --------NO    HAVE YOU OR ANY FAMILY MEMBER TRAVELLED PAST 14 DAYS OUT OF THE   COUNTY-NO--  STATE----NO COUNTRY----NO  HAVE YOU OR ANY FAMILY MEMBER BEEN EXPOSED TO ANYONE WITH COVID 19? NO

## 2018-11-08 NOTE — Progress Notes (Signed)
Spoke with: Marilyn Gibson NPO:  No food after midnight/Clear liquids until 4:30AM DOS/DRINK Arrival time: 50AM Labs: UPT (EKG, CBC,BMP, COVID 11/12/2018 in epic) AM medications: Cetirizine, BC pill Pre op orders: Yes Ride home:  Chriss Czar (husband) [phone]

## 2018-11-11 ENCOUNTER — Encounter: Payer: Self-pay | Admitting: Gynecology

## 2018-11-12 ENCOUNTER — Other Ambulatory Visit (HOSPITAL_COMMUNITY)
Admission: RE | Admit: 2018-11-12 | Discharge: 2018-11-12 | Disposition: A | Discharge status: Home / Self Care | Payer: Managed Care, Other (non HMO) | Source: Ambulatory Visit

## 2018-11-12 ENCOUNTER — Other Ambulatory Visit: Payer: Self-pay

## 2018-11-12 ENCOUNTER — Encounter (HOSPITAL_COMMUNITY)
Admission: RE | Admit: 2018-11-12 | Discharge: 2018-11-12 | Disposition: A | Discharge status: Home / Self Care | Payer: Managed Care, Other (non HMO) | Source: Ambulatory Visit | Attending: Gynecology | Admitting: Gynecology

## 2018-11-12 DIAGNOSIS — Z79899 Other long term (current) drug therapy: Secondary | ICD-10-CM | POA: Diagnosis not present

## 2018-11-12 DIAGNOSIS — Z01818 Encounter for other preprocedural examination: Secondary | ICD-10-CM | POA: Insufficient documentation

## 2018-11-12 DIAGNOSIS — K219 Gastro-esophageal reflux disease without esophagitis: Secondary | ICD-10-CM | POA: Diagnosis not present

## 2018-11-12 DIAGNOSIS — Z793 Long term (current) use of hormonal contraceptives: Secondary | ICD-10-CM | POA: Diagnosis not present

## 2018-11-12 DIAGNOSIS — N840 Polyp of corpus uteri: Secondary | ICD-10-CM | POA: Diagnosis not present

## 2018-11-12 DIAGNOSIS — N921 Excessive and frequent menstruation with irregular cycle: Secondary | ICD-10-CM | POA: Diagnosis not present

## 2018-11-12 DIAGNOSIS — Z1159 Encounter for screening for other viral diseases: Secondary | ICD-10-CM | POA: Insufficient documentation

## 2018-11-12 DIAGNOSIS — I10 Essential (primary) hypertension: Secondary | ICD-10-CM | POA: Diagnosis not present

## 2018-11-12 DIAGNOSIS — J45909 Unspecified asthma, uncomplicated: Secondary | ICD-10-CM | POA: Diagnosis not present

## 2018-11-12 LAB — BASIC METABOLIC PANEL
Anion gap: 7 (ref 5–15)
BUN: 13 mg/dL (ref 6–20)
CO2: 25 mmol/L (ref 22–32)
Calcium: 8.8 mg/dL — ABNORMAL LOW (ref 8.9–10.3)
Chloride: 107 mmol/L (ref 98–111)
Creatinine, Ser: 0.82 mg/dL (ref 0.44–1.00)
GFR calc Af Amer: >60 mL/min (ref >60)
GFR calc non Af Amer: >60 mL/min (ref >60)
Glucose, Bld: 86 mg/dL (ref 70–99)
Potassium: 3.7 mmol/L (ref 3.5–5.1)
Sodium: 139 mmol/L (ref 135–145)

## 2018-11-12 LAB — CBC
HCT: 37.8 % (ref 36.0–46.0)
Hemoglobin: 12.3 g/dL (ref 12.0–15.0)
MCH: 30.4 pg (ref 26.0–34.0)
MCHC: 32.5 g/dL (ref 30.0–36.0)
MCV: 93.3 fL (ref 80.0–100.0)
Platelets: 340 10*3/uL (ref 150–400)
RBC: 4.05 MIL/uL (ref 3.87–5.11)
RDW: 13.5 % (ref 11.5–15.5)
WBC: 4.5 10*3/uL (ref 4.0–10.5)
nRBC: 0 % (ref 0.0–0.2)

## 2018-11-13 LAB — NOVEL CORONAVIRUS, NAA (HOSP ORDER, SEND-OUT TO REF LAB; TAT 18-24 HRS): SARS-CoV-2, NAA: NOT DETECTED

## 2018-11-13 NOTE — Progress Notes (Signed)
Final EKG dated 11-12-2018 in epic.

## 2018-11-14 NOTE — Progress Notes (Signed)
SPOKE W/  _ Unable to reach, went to [place] 19:   COUGH--  RUNNY NOSE---   SORE THROAT---  NASAL CONGESTION----  SNEEZING----  SHORTNESS OF BREATH---  DIFFICULTY BREATHING---  TEMP >100.0 -----  UNEXPLAINED BODY ACHES------  CHILLS --------   HEADACHES ---------  LOSS OF SMELL/ TASTE --------    HAVE YOU OR ANY FAMILY MEMBER TRAVELLED PAST 14 DAYS OUT OF THE   COUNTY--- STATE---- COUNTRY----  HAVE YOU OR ANY FAMILY MEMBER BEEN EXPOSED TO ANYONE WITH COVID 19?

## 2018-11-14 NOTE — Anesthesia Preprocedure Evaluation (Addendum)
Anesthesia Evaluation  Patient identified by MRN, date of birth, ID band Patient awake    Reviewed: Allergy & Precautions, Patient's Chart, lab work & pertinent test results  Airway Mallampati: II  TM Distance: >3 FB Neck ROM: Full    Dental no notable dental hx. (+) Teeth Intact   Pulmonary asthma ,    Pulmonary exam normal breath sounds clear to auscultation       Cardiovascular Exercise Tolerance: Good hypertension, Normal cardiovascular exam Rhythm:Regular Rate:Normal     Neuro/Psych  Headaches, negative psych ROS   GI/Hepatic Neg liver ROS, GERD  ,  Endo/Other    Renal/GU   negative genitourinary   Musculoskeletal   Abdominal   Peds  Hematology hgb 12.3   Anesthesia Other Findings   Reproductive/Obstetrics                           Anesthesia Physical Anesthesia Plan  ASA: II  Anesthesia Plan: General   Post-op Pain Management:    Induction: Intravenous  PONV Risk Score and Plan: 4 or greater and Treatment may vary due to age or medical condition, Ondansetron, Dexamethasone and Scopolamine patch - Pre-op  Airway Management Planned: LMA  Additional Equipment:   Intra-op Plan:   Post-operative Plan:   Informed Consent: I have reviewed the patients History and Physical, chart, labs and discussed the procedure including the risks, benefits and alternatives for the proposed anesthesia with the patient or authorized representative who has indicated his/her understanding and acceptance.     Dental advisory given  Plan Discussed with:   Anesthesia Plan Comments: (For GA w LMA)       Anesthesia Quick Evaluation

## 2018-11-15 ENCOUNTER — Encounter (HOSPITAL_BASED_OUTPATIENT_CLINIC_OR_DEPARTMENT_OTHER): Payer: Self-pay

## 2018-11-15 ENCOUNTER — Encounter (HOSPITAL_BASED_OUTPATIENT_CLINIC_OR_DEPARTMENT_OTHER)
Admission: RE | Disposition: A | Discharge status: Home / Self Care | Payer: Self-pay | Source: Home / Self Care | Attending: Gynecology

## 2018-11-15 ENCOUNTER — Ambulatory Visit (HOSPITAL_BASED_OUTPATIENT_CLINIC_OR_DEPARTMENT_OTHER): Payer: Managed Care, Other (non HMO) | Admitting: Anesthesiology

## 2018-11-15 ENCOUNTER — Ambulatory Visit (HOSPITAL_BASED_OUTPATIENT_CLINIC_OR_DEPARTMENT_OTHER)
Admission: RE | Admit: 2018-11-15 | Discharge: 2018-11-15 | Disposition: A | Discharge status: Home / Self Care | Payer: Managed Care, Other (non HMO) | Attending: Gynecology | Admitting: Gynecology

## 2018-11-15 ENCOUNTER — Ambulatory Visit (HOSPITAL_BASED_OUTPATIENT_CLINIC_OR_DEPARTMENT_OTHER): Payer: Managed Care, Other (non HMO) | Admitting: Physician Assistant

## 2018-11-15 DIAGNOSIS — Z79899 Other long term (current) drug therapy: Secondary | ICD-10-CM | POA: Insufficient documentation

## 2018-11-15 DIAGNOSIS — Z793 Long term (current) use of hormonal contraceptives: Secondary | ICD-10-CM | POA: Insufficient documentation

## 2018-11-15 DIAGNOSIS — N840 Polyp of corpus uteri: Secondary | ICD-10-CM

## 2018-11-15 DIAGNOSIS — N921 Excessive and frequent menstruation with irregular cycle: Secondary | ICD-10-CM | POA: Insufficient documentation

## 2018-11-15 DIAGNOSIS — Z1159 Encounter for screening for other viral diseases: Secondary | ICD-10-CM | POA: Insufficient documentation

## 2018-11-15 DIAGNOSIS — I10 Essential (primary) hypertension: Secondary | ICD-10-CM | POA: Insufficient documentation

## 2018-11-15 DIAGNOSIS — K219 Gastro-esophageal reflux disease without esophagitis: Secondary | ICD-10-CM | POA: Insufficient documentation

## 2018-11-15 DIAGNOSIS — J45909 Unspecified asthma, uncomplicated: Secondary | ICD-10-CM | POA: Insufficient documentation

## 2018-11-15 HISTORY — DX: Presence of spectacles and contact lenses: Z97.3

## 2018-11-15 HISTORY — PX: DILATATION & CURETTAGE/HYSTEROSCOPY WITH MYOSURE: SHX6511

## 2018-11-15 HISTORY — DX: Polyp of corpus uteri: N84.0

## 2018-11-15 HISTORY — DX: Other complications of anesthesia, initial encounter: T88.59XA

## 2018-11-15 HISTORY — DX: Adverse effect of unspecified anesthetic, initial encounter: T41.45XA

## 2018-11-15 HISTORY — DX: Pneumonia, unspecified organism: J18.9

## 2018-11-15 HISTORY — DX: Vitamin D deficiency, unspecified: E55.9

## 2018-11-15 HISTORY — DX: Excessive and frequent menstruation with regular cycle: N92.0

## 2018-11-15 LAB — POCT PREGNANCY, URINE: Preg Test, Ur: NEGATIVE

## 2018-11-15 SURGERY — DILATATION & CURETTAGE/HYSTEROSCOPY WITH MYOSURE
Anesthesia: General

## 2018-11-15 MED ORDER — CIPROFLOXACIN IN D5W 400 MG/200ML IV SOLN
400.0000 mg | INTRAVENOUS | Status: AC
  Administered 2018-11-15: 08:00:00 400 mg via INTRAVENOUS
  Filled 2018-11-15: qty 200

## 2018-11-15 MED ORDER — METRONIDAZOLE IN NACL 5-0.79 MG/ML-% IV SOLN
INTRAVENOUS | Status: AC
  Filled 2018-11-15: qty 100

## 2018-11-15 MED ORDER — MIDAZOLAM HCL 5 MG/5ML IJ SOLN
INTRAMUSCULAR | Status: DC | PRN
  Administered 2018-11-15: 2 mg via INTRAVENOUS

## 2018-11-15 MED ORDER — PROPOFOL 10 MG/ML IV BOLUS
INTRAVENOUS | Status: DC | PRN
  Administered 2018-11-15: 200 mg via INTRAVENOUS

## 2018-11-15 MED ORDER — FENTANYL CITRATE (PF) 100 MCG/2ML IJ SOLN
INTRAMUSCULAR | Status: AC
  Filled 2018-11-15: qty 2

## 2018-11-15 MED ORDER — FENTANYL CITRATE (PF) 100 MCG/2ML IJ SOLN
INTRAMUSCULAR | Status: DC | PRN
  Administered 2018-11-15 (×4): 25 ug via INTRAVENOUS

## 2018-11-15 MED ORDER — DEXAMETHASONE SODIUM PHOSPHATE 10 MG/ML IJ SOLN
INTRAMUSCULAR | Status: AC
  Filled 2018-11-15: qty 1

## 2018-11-15 MED ORDER — GABAPENTIN 300 MG PO CAPS
300.0000 mg | ORAL_CAPSULE | Freq: Once | ORAL | Status: AC
  Administered 2018-11-15: 300 mg via ORAL
  Filled 2018-11-15: qty 1

## 2018-11-15 MED ORDER — ONDANSETRON HCL 4 MG/2ML IJ SOLN
INTRAMUSCULAR | Status: DC | PRN
  Administered 2018-11-15: 4 mg via INTRAVENOUS

## 2018-11-15 MED ORDER — ONDANSETRON HCL 4 MG/2ML IJ SOLN
4.0000 mg | Freq: Once | INTRAMUSCULAR | Status: AC | PRN
  Administered 2018-11-15: 09:00:00 4 mg via INTRAVENOUS
  Filled 2018-11-15: qty 2

## 2018-11-15 MED ORDER — KETOROLAC TROMETHAMINE 30 MG/ML IJ SOLN
INTRAMUSCULAR | Status: AC
  Filled 2018-11-15: qty 1

## 2018-11-15 MED ORDER — CIPROFLOXACIN IN D5W 400 MG/200ML IV SOLN
INTRAVENOUS | Status: AC
  Filled 2018-11-15: qty 200

## 2018-11-15 MED ORDER — KETOROLAC TROMETHAMINE 30 MG/ML IJ SOLN
30.0000 mg | Freq: Once | INTRAMUSCULAR | Status: DC | PRN
  Filled 2018-11-15: qty 1

## 2018-11-15 MED ORDER — KETOROLAC TROMETHAMINE 30 MG/ML IJ SOLN
INTRAMUSCULAR | Status: DC | PRN
  Administered 2018-11-15: 30 mg via INTRAVENOUS

## 2018-11-15 MED ORDER — OXYCODONE HCL 5 MG/5ML PO SOLN
5.0000 mg | Freq: Once | ORAL | Status: DC | PRN
  Filled 2018-11-15: qty 5

## 2018-11-15 MED ORDER — MIDAZOLAM HCL 2 MG/2ML IJ SOLN
INTRAMUSCULAR | Status: AC
  Filled 2018-11-15: qty 2

## 2018-11-15 MED ORDER — GABAPENTIN 300 MG PO CAPS
ORAL_CAPSULE | ORAL | Status: AC
  Filled 2018-11-15: qty 1

## 2018-11-15 MED ORDER — ONDANSETRON HCL 4 MG/2ML IJ SOLN
INTRAMUSCULAR | Status: AC
  Filled 2018-11-15: qty 2

## 2018-11-15 MED ORDER — ACETAMINOPHEN 500 MG PO TABS
ORAL_TABLET | ORAL | Status: AC
  Filled 2018-11-15: qty 2

## 2018-11-15 MED ORDER — OXYCODONE HCL 5 MG PO TABS
5.0000 mg | ORAL_TABLET | Freq: Once | ORAL | Status: DC | PRN
  Filled 2018-11-15: qty 1

## 2018-11-15 MED ORDER — LIDOCAINE HCL (CARDIAC) PF 100 MG/5ML IV SOSY
PREFILLED_SYRINGE | INTRAVENOUS | Status: DC | PRN
  Administered 2018-11-15: 100 mg via INTRAVENOUS

## 2018-11-15 MED ORDER — LIDOCAINE HCL 1 % IJ SOLN
INTRAMUSCULAR | Status: DC | PRN
  Administered 2018-11-15: 10 mL

## 2018-11-15 MED ORDER — METRONIDAZOLE IN NACL 5-0.79 MG/ML-% IV SOLN
500.0000 mg | INTRAVENOUS | Status: AC
  Administered 2018-11-15: 07:00:00 500 mg via INTRAVENOUS
  Filled 2018-11-15: qty 100

## 2018-11-15 MED ORDER — LIDOCAINE 2% (20 MG/ML) 5 ML SYRINGE
INTRAMUSCULAR | Status: AC
  Filled 2018-11-15: qty 5

## 2018-11-15 MED ORDER — ACETAMINOPHEN 500 MG PO TABS
1000.0000 mg | ORAL_TABLET | Freq: Once | ORAL | Status: AC
  Administered 2018-11-15: 1000 mg via ORAL
  Filled 2018-11-15: qty 2

## 2018-11-15 MED ORDER — MEPERIDINE HCL 25 MG/ML IJ SOLN
6.2500 mg | INTRAMUSCULAR | Status: DC | PRN
  Filled 2018-11-15: qty 1

## 2018-11-15 MED ORDER — DEXAMETHASONE SODIUM PHOSPHATE 4 MG/ML IJ SOLN
INTRAMUSCULAR | Status: DC | PRN
  Administered 2018-11-15: 10 mg via INTRAVENOUS

## 2018-11-15 MED ORDER — LACTATED RINGERS IV SOLN
INTRAVENOUS | Status: DC
  Administered 2018-11-15 (×2): via INTRAVENOUS
  Filled 2018-11-15: qty 1000

## 2018-11-15 MED ORDER — PROPOFOL 10 MG/ML IV BOLUS
INTRAVENOUS | Status: AC
  Filled 2018-11-15: qty 40

## 2018-11-15 MED ORDER — FENTANYL CITRATE (PF) 100 MCG/2ML IJ SOLN
25.0000 ug | INTRAMUSCULAR | Status: DC | PRN
  Filled 2018-11-15: qty 1

## 2018-11-15 SURGICAL SUPPLY — 19 items
CANISTER SUCT 3000ML PPV (MISCELLANEOUS) ×3 IMPLANT
CATH ROBINSON RED A/P 16FR (CATHETERS) ×3 IMPLANT
COVER WAND RF STERILE (DRAPES) ×3 IMPLANT
DEVICE MYOSURE LITE (MISCELLANEOUS) IMPLANT
DEVICE MYOSURE REACH (MISCELLANEOUS) ×2 IMPLANT
DILATOR CANAL MILEX (MISCELLANEOUS) IMPLANT
GAUZE 4X4 16PLY RFD (DISPOSABLE) ×3 IMPLANT
GLOVE BIO SURGEON STRL SZ7.5 (GLOVE) ×6 IMPLANT
GOWN STRL REUS W/TWL XL LVL3 (GOWN DISPOSABLE) ×3 IMPLANT
IV NS IRRIG 3000ML ARTHROMATIC (IV SOLUTION) ×3 IMPLANT
KIT PROCEDURE FLUENT (KITS) ×3 IMPLANT
KIT TURNOVER CYSTO (KITS) ×3 IMPLANT
MYOSURE XL FIBROID (MISCELLANEOUS)
PACK VAGINAL MINOR WOMEN LF (CUSTOM PROCEDURE TRAY) ×3 IMPLANT
PAD OB MATERNITY 4.3X12.25 (PERSONAL CARE ITEMS) ×3 IMPLANT
SEAL ROD LENS SCOPE MYOSURE (ABLATOR) ×3 IMPLANT
SYSTEM TISS REMOVAL MYOSURE XL (MISCELLANEOUS) IMPLANT
TOWEL OR 17X26 10 PK STRL BLUE (TOWEL DISPOSABLE) ×6 IMPLANT
WATER STERILE IRR 500ML POUR (IV SOLUTION) IMPLANT

## 2018-11-15 NOTE — Op Note (Signed)
Marilyn Gibson 03/01/1971 [id number]   Post Operative Note   Date of surgery:  11/15/2018  Pre Op Dx: Endometrial polyp, heavy irregular bleeding  Post Op Dx: Endometrial polyp, heavy irregular bleeding  Procedure: Hysteroscopy, D&C, MyoSure resection endometrial polyp  Surgeon:  Belinda Block Makari Sanko  Anesthesia:  General  EBL: 10 cc  Distended media discrepancy: 094 cc saline  Complications:  None  Specimen: #1 endometrial curetting #2 endometrial polyps to pathology  Findings: EUA: External BUS vagina normal.  Cervix normal.  Uterus grossly normal size midline mobile.  Adnexa without masses   Hysteroscopy: Adequate noting fundus, right/left tubal ostia, anterior/posterior endometrial surfaces, lower uterine segment and endocervical canal all visualized.  Multiple small polyps right upper lateral endometrial surface and lower posterior midline endometrial surface.  All removed with MyoSure resection and curettage.  Procedure:  The patient was taken to the operating room, was placed in the low dorsal lithotomy position, underwent general anesthesia, received a perineal/vaginal preparation per nursing personnel and the bladder was emptied with an in and out Foley catheterization. The timeout was performed by the surgical team. An EUA was performed. The patient was draped in the usual fashion. The cervix was visualized with a speculum, anterior lip grasped with a single-tooth tenaculum and a paracervical block was placed using 10 cc's of 1% lidocaine. The cervix was gently dilated to admit the Myosure hysteroscope and hysteroscopy was performed with findings noted above. Using the Myosure Reach resectoscopic wand the polyps were resected in their entirety to the level the surrounding endometrium. A gentle sharp curettage was performed. Both specimens were sent separately to pathology.  Repeat hysteroscopy showed an empty cavity with good distention and no evidence of perforation. The instruments  were removed and adequate hemostasis was visualized at the tenaculum site and external cervical os.  The specimens were identified for pathology.  The sponge, needle and instrument count were verified correct.  The patient was wanded per protocol.  The patient was given intraoperative Toradol, was awakened without difficulty and was taken to the recovery room in good condition having tolerated the procedure well.   Anastasio Auerbach MD, 9:04 AM 11/15/2018

## 2018-11-15 NOTE — Transfer of Care (Signed)
Immediate Anesthesia Transfer of Care Note Immediate Anesthesia Transfer of Care Note  Patient: Marilyn Gibson  Procedure(s) Performed: Procedure(s) (LRB): DILATATION & CURETTAGE/HYSTEROSCOPY WITH MYOSURE (N/A)  Patient Location: PACU  Anesthesia Type: General  Level of Consciousness: awake, sedated, patient cooperative and responds to stimulation  Airway & Oxygen Therapy: Patient Spontanous Breathing and Patient connected to face mask oxygen  Post-op Assessment: Report given to PACU RN, Post -op Vital signs reviewed and stable and Patient moving all extremities  Post vital signs: Reviewed and stable  Complications: No apparent anesthesia complications

## 2018-11-15 NOTE — Discharge Instructions (Signed)
° °  D & C Home care Instructions:   Personal hygiene:  Used sanitary napkins for vaginal drainage not tampons. Shower or tub bathe the day after your procedure. No douching until bleeding stops. Always wipe from front to back after  Elimination.  Activity: Do not drive or operate any equipment today. The effects of the anesthesia are still present and drowsiness may result. Rest today, not necessarily flat bed rest, just take it easy. You may resume your normal activity in one to 2 days.  Sexual activity: No intercourse for one week or as indicated by your physician  Diet: Eat a light diet as desired this evening. You may resume a regular diet tomorrow.  Return to work: One to 2 days.  General Expectations of your surgery: Vaginal bleeding should be no heavier than a normal period. Spotting may continue up to 10 days. Mild cramps may continue for a couple of days. You may have a regular period in 2-6 weeks.  Unexpected observations call your doctor if these occur: persistent or heavy bleeding. Severe abdominal cramping or pain. Elevation of temperature greater than 100F.   Post Anesthesia Home Care Instructions  Activity: Get plenty of rest for the remainder of the day. A responsible individual must stay with you for 24 hours following the procedure.  For the next 24 hours, DO NOT: -Drive a car -Paediatric nurse -Drink alcoholic beverages -Take any medication unless instructed by your physician -Make any legal decisions or sign important papers.  Meals: Start with liquid foods such as gelatin or soup. Progress to regular foods as tolerated. Avoid greasy, spicy, heavy foods. If nausea and/or vomiting occur, drink only clear liquids until the nausea and/or vomiting subsides. Call your physician if vomiting continues.  Special Instructions/Symptoms: Your throat may feel dry or sore from the anesthesia or the breathing tube placed in your throat during surgery. If this causes  discomfort, gargle with warm salt water. The discomfort should disappear within 24 hours.  If you had a scopolamine patch placed behind your ear for the management of post- operative nausea and/or vomiting:  1. The medication in the patch is effective for 72 hours, after which it should be removed.  Wrap patch in a tissue and discard in the trash. Wash hands thoroughly with soap and water. 2. You may remove the patch earlier than 72 hours if you experience unpleasant side effects which may include dry mouth, dizziness or visual disturbances. 3. Avoid touching the patch. Wash your hands with soap and water after contact with the patch.    May take Ibuprofen at 2 PM as needed for pain/cramping. May take Tylenol at 10 AM as needed for pain/cramping.

## 2018-11-15 NOTE — Anesthesia Procedure Notes (Signed)
Procedure Name: LMA Insertion Date/Time: 11/15/2018 7:31 AM Performed by: Justice Rocher, CRNA Pre-anesthesia Checklist: Patient identified, Emergency Drugs available, Suction available and Patient being monitored Patient Re-evaluated:Patient Re-evaluated prior to induction Oxygen Delivery Method: Circle system utilized Preoxygenation: Pre-oxygenation with 100% oxygen Induction Type: IV induction Ventilation: Mask ventilation without difficulty LMA: LMA inserted LMA Size: 4.0 Number of attempts: 1 Airway Equipment and Method: Bite block Placement Confirmation: positive ETCO2 and breath sounds checked- equal and bilateral Tube secured with: Tape Dental Injury: Teeth and Oropharynx as per pre-operative assessment

## 2018-11-15 NOTE — Anesthesia Postprocedure Evaluation (Signed)
Anesthesia Post Note  Patient: Marilyn Gibson  Procedure(s) Performed: DILATATION & CURETTAGE/HYSTEROSCOPY WITH MYOSURE (N/A )     Patient location during evaluation: PACU Anesthesia Type: General Level of consciousness: awake and alert Pain management: pain level controlled Vital Signs Assessment: post-procedure vital signs reviewed and stable Respiratory status: spontaneous breathing, nonlabored ventilation, respiratory function stable and patient connected to nasal cannula oxygen Cardiovascular status: blood pressure returned to baseline and stable Postop Assessment: no apparent nausea or vomiting Anesthetic complications: no    Last Vitals:  Vitals:   11/15/18 0845 11/15/18 0909  BP: 130/75 (!) 146/84  Pulse: 67 60  Resp: (!) 9 12  Temp:  36.6 C  SpO2: 95% 100%    Last Pain:  Vitals:   11/15/18 0909  TempSrc:   PainSc: 0-No pain                 Barnet Glasgow

## 2018-11-15 NOTE — H&P (Signed)
The patient was examined.  I reviewed the proposed surgery and consent form with the patient.  The dictated history and physical is current and accurate and all questions were answered. The patient is ready to proceed with surgery and has a realistic understanding and expectation for the outcome.   Anastasio Auerbach MD, 7:02 AM 11/15/2018

## 2018-11-18 ENCOUNTER — Encounter: Payer: Self-pay | Admitting: Gynecology

## 2018-11-18 ENCOUNTER — Encounter (HOSPITAL_BASED_OUTPATIENT_CLINIC_OR_DEPARTMENT_OTHER): Payer: Self-pay | Admitting: Gynecology

## 2018-12-02 ENCOUNTER — Other Ambulatory Visit: Payer: Self-pay | Admitting: Gynecology

## 2019-01-01 ENCOUNTER — Other Ambulatory Visit: Payer: Self-pay

## 2019-01-02 ENCOUNTER — Ambulatory Visit (INDEPENDENT_AMBULATORY_CARE_PROVIDER_SITE_OTHER): Payer: Managed Care, Other (non HMO) | Admitting: Gynecology

## 2019-01-02 ENCOUNTER — Encounter: Payer: Self-pay | Admitting: Gynecology

## 2019-01-02 VITALS — BP 118/74

## 2019-01-02 DIAGNOSIS — N898 Other specified noninflammatory disorders of vagina: Secondary | ICD-10-CM

## 2019-01-02 LAB — WET PREP FOR TRICH, YEAST, CLUE

## 2019-01-02 MED ORDER — FLUCONAZOLE 150 MG PO TABS: 150.0000 mg | ORAL_TABLET | Freq: Once | ORAL | 0 refills | Status: AC

## 2019-01-02 MED ORDER — METRONIDAZOLE 500 MG PO TABS: 500.0000 mg | ORAL_TABLET | Freq: Two times a day (BID) | ORAL | 0 refills | Status: DC

## 2019-01-02 NOTE — Progress Notes (Signed)
    Marilyn Gibson [birth year] [id number]        47 y.o.  [id number] with vaginal discharge with itching and odor starting last week.  Used OTC 3-day antifungal with improvement in her symptoms but they still have lingered with slight itching and slight discharge.  No urinary symptoms such as frequency dysuria urgency low back pain fever or chills.  Past medical history,surgical history, problem list, medications, allergies, family history and social history were all reviewed and documented in the EPIC chart.  Directed ROS with pertinent positives and negatives documented in the history of present illness/assessment and plan.  Exam: Caryn Bee assistant Vitals:   01/02/19 1405  BP: 118/74   General appearance:  Normal Abdomen soft nontender without masses guarding rebound Pelvic external BUS vagina with white discharge.  Cervix normal.  Uterus grossly normal midline mobile nontender.  Adnexa without masses or tenderness.  Assessment/Plan:  48 y.o. [id number] with history as above.  Wet prep positive for yeast and bacterial vaginosis.  Will cover with Diflucan 150 mg x 1 day and Flagyl 500 mg twice daily x7 days, alcohol avoidance reviewed.  Patient will follow-up if her symptoms persist, worsen or recur.    Anastasio Auerbach MD, 2:55 PM 01/02/2019

## 2019-01-02 NOTE — Patient Instructions (Signed)
Take the one Diflucan pill to treat the yeast infection  Take the Flagyl antibiotic twice daily for 7 days to treat the bacterial infection.  Avoid alcohol while taking.  Follow-up if your symptoms persist, worsen or recur.

## 2019-02-12 ENCOUNTER — Ambulatory Visit: Payer: Managed Care, Other (non HMO) | Admitting: Gynecology

## 2019-02-12 ENCOUNTER — Other Ambulatory Visit: Payer: Self-pay

## 2019-02-12 ENCOUNTER — Encounter: Payer: Self-pay | Admitting: Gynecology

## 2019-02-12 VITALS — BP 128/80

## 2019-02-12 DIAGNOSIS — N3000 Acute cystitis without hematuria: Secondary | ICD-10-CM

## 2019-02-12 DIAGNOSIS — R350 Frequency of micturition: Secondary | ICD-10-CM

## 2019-02-12 MED ORDER — NITROFURANTOIN MONOHYD MACRO 100 MG PO CAPS: 100.0000 mg | ORAL_CAPSULE | Freq: Two times a day (BID) | ORAL | 0 refills | Status: DC

## 2019-02-12 NOTE — Progress Notes (Signed)
    Evamaria Detore 1971-06-30 [id number]        48 y.o.  [id number] presents with several days of worsening frequency, dysuria and urgency.  No fever or chills.  No low back pain.  No vaginal discharge irritation or odor.  Past medical history,surgical history, problem list, medications, allergies, family history and social history were all reviewed and documented in the EPIC chart.  Directed ROS with pertinent positives and negatives documented in the history of present illness/assessment and plan.  Exam: Wandra Scot assistant Vitals:   02/12/19 1204  BP: 128/80   General appearance:  Normal Spine straight without CVA tenderness Abdomen soft nontender without masses guarding rebound Pelvic external BUS vagina normal.  Bimanual without masses or tenderness.  Assessment/Plan:  48 y.o. [id number] with history and urine analysis consistent with UTI.  Will treat with Macrobid 100 mg twice daily x7 days.  Follow-up if symptoms persist, worsen or recur.    Anastasio Auerbach MD, 12:19 PM 02/12/2019

## 2019-02-12 NOTE — Patient Instructions (Signed)
Take the prescribed antibiotic twice daily for 7 days.  Follow-up if your symptoms persist, worsen or recur. 

## 2019-02-15 LAB — URINE CULTURE
MICRO NUMBER:: 768102
SPECIMEN QUALITY:: ADEQUATE

## 2019-02-15 LAB — URINALYSIS, COMPLETE W/RFL CULTURE
Bilirubin Urine: NEGATIVE
Glucose, UA: NEGATIVE
Hyaline Cast: NONE SEEN /LPF
Ketones, ur: NEGATIVE
Nitrites, Initial: NEGATIVE
Protein, ur: NEGATIVE
Specific Gravity, Urine: 1.01 (ref 1.001–1.03)
WBC, UA: >=60 /HPF — AB (ref 0–5)
pH: 5.5 (ref 5.0–8.0)

## 2019-02-15 LAB — CULTURE INDICATED

## 2019-02-22 ENCOUNTER — Other Ambulatory Visit: Payer: Self-pay | Admitting: Gynecology

## 2019-02-27 ENCOUNTER — Telehealth: Payer: Self-pay | Admitting: *Deleted

## 2019-02-27 ENCOUNTER — Other Ambulatory Visit: Payer: Self-pay | Admitting: Gynecology

## 2019-02-27 MED ORDER — NORETHINDRONE ACET-ETHINYL EST 1-20 MG-MCG PO TABS: 1.0000 | ORAL_TABLET | Freq: Every day | ORAL | 9 refills | Status: DC

## 2019-02-27 NOTE — Telephone Encounter (Signed)
Patient called requesting Junel 1/20 refill had D&C 11/15/18 has been doing well, only had couple of days of breakthrough light bleeding, would like to continue the pills. Report while taking pills she has not had any heavy bleeding, it helps her skin, and weight. She would like to continue.  Please advise

## 2019-02-27 NOTE — Telephone Encounter (Signed)
Okay to continue for now through her next scheduled annual exam

## 2019-02-27 NOTE — Telephone Encounter (Signed)
Patient informed, Rx sent.  

## 2019-03-25 ENCOUNTER — Encounter: Payer: Self-pay | Admitting: Gynecology

## 2019-06-02 ENCOUNTER — Telehealth: Payer: Self-pay | Admitting: *Deleted

## 2019-06-02 MED ORDER — MEGESTROL ACETATE 20 MG PO TABS: ORAL_TABLET | ORAL | 0 refills | Status: DC

## 2019-06-02 NOTE — Telephone Encounter (Signed)
Stop birth control pill.  Megace 20 mg twice daily x5 days then daily x5 days.  Restart oral contraceptives at the end of this.

## 2019-06-02 NOTE — Telephone Encounter (Signed)
Patient informed, Rx sent.  

## 2019-06-02 NOTE — Telephone Encounter (Addendum)
Patient called c/o bleeding x 3 weeks now with Junel 1/20 pill, reports in last pack she noticed spotting with active pills 1 week before cycle due to start, changing pads every 3 hours, small clots, cramping. Patient does report changing pads at one point every 2 hours.  Please advise

## 2019-06-12 ENCOUNTER — Other Ambulatory Visit: Payer: Self-pay

## 2019-06-12 ENCOUNTER — Ambulatory Visit: Payer: Managed Care, Other (non HMO) | Admitting: Gynecology

## 2019-06-12 ENCOUNTER — Encounter: Payer: Self-pay | Admitting: Gynecology

## 2019-06-12 ENCOUNTER — Telehealth: Payer: Self-pay | Admitting: *Deleted

## 2019-06-12 VITALS — BP 120/74

## 2019-06-12 DIAGNOSIS — N926 Irregular menstruation, unspecified: Secondary | ICD-10-CM

## 2019-06-12 LAB — CBC WITH DIFFERENTIAL/PLATELET
Absolute Monocytes: 459 cells/uL (ref 200–950)
Basophils Absolute: 49 cells/uL (ref 0–200)
Basophils Relative: 0.9 %
Eosinophils Absolute: 22 cells/uL (ref 15–500)
Eosinophils Relative: 0.4 %
HCT: 39.6 % (ref 35.0–45.0)
Hemoglobin: 13.3 g/dL (ref 11.7–15.5)
Lymphs Abs: 1652 cells/uL (ref 850–3900)
MCH: 30.5 pg (ref 27.0–33.0)
MCHC: 33.6 g/dL (ref 32.0–36.0)
MCV: 90.8 fL (ref 80.0–100.0)
MPV: 11 fL (ref 7.5–12.5)
Monocytes Relative: 8.5 %
Neutro Abs: 3218 cells/uL (ref 1500–7800)
Neutrophils Relative %: 59.6 %
Platelets: 383 10*3/uL (ref 140–400)
RBC: 4.36 10*6/uL (ref 3.80–5.10)
RDW: 13 % (ref 11.0–15.0)
Total Lymphocyte: 30.6 %
WBC: 5.4 10*3/uL (ref 3.8–10.8)

## 2019-06-12 LAB — HCG, SERUM, QUALITATIVE: Preg, Serum: NEGATIVE

## 2019-06-12 MED ORDER — ORTHO-NOVUM 1/35 (28) 1-35 MG-MCG PO TABS: 1.0000 | ORAL_TABLET | Freq: Every day | ORAL | 3 refills | Status: DC

## 2019-06-12 NOTE — Progress Notes (Signed)
    Marilyn Gibson [birth year] [id number]        48 y.o.  [id number] presents complaining of irregular bleeding, heavy at times.  History of heavy irregular bleeding.  Underwent hysteroscopy D&C for endometrial polyps in May.  Had been on low-dose 1/20 oral contraceptives with regular menses.  Started bleeding in between her menses in November and has continued to bleed on and off sometimes heavy passing clots.  Short course of Megace with continued bleeding despite this.  History of tubal sterilization.  Past medical history,surgical history, problem list, medications, allergies, family history and social history were all reviewed and documented in the EPIC chart.  Directed ROS with pertinent positives and negatives documented in the history of present illness/assessment and plan.  Exam: Caryn Bee assistant Vitals:   06/12/19 1109  BP: 120/74   General appearance:  Normal Abdomen soft nontender without masses guarding rebound Pelvic external BUS vagina with light menses flow.  Cervix normal.  Uterus grossly normal midline mobile nontender.  Adnexa without masses or tenderness.  Assessment/Plan:  48 y.o. [id number] with irregular bleeding on low-dose oral contraceptives.  Recent hysteroscopy D&C 6 to 7 months ago for benign polyps.  Did well initially.  Discussed differential to to include possible regrowth of polyps.  Would be unusual this quick.  Options for management reviewed to include hormonal manipulation, trial of Mirena IUD, endometrial ablation, hysterectomy.  Had Mirena IUD a number of years ago but developed headaches with this.  Recommend for now increasing her oral contraceptive to 1/35 and see how she does with this.  Risk of thrombosis to include stroke heart attack DVT with increased estrogen.  We will do this for 3 months assuming she has control of her cycle and then go back down to the 1/20.  We will follow-up if irregular bleeding continues to consider alternative management.     Anastasio Auerbach MD, 11:27 AM 06/12/2019

## 2019-06-12 NOTE — Telephone Encounter (Signed)
Patient called c/o heavy bleeding after completing megace given on 06/02/19, patient said bleeding did slow down for 3 days, and started back heavy again on day 4 of megace. Reports yesterday bleeding was very heavy,changed pads twice in 1 hour. I told her best to schedule office visit, transferred to appointment desk.

## 2019-06-12 NOTE — Patient Instructions (Signed)
Started on the higher dose birth control pill.  Call if irregular bleeding continues.

## 2019-06-13 ENCOUNTER — Encounter: Payer: Self-pay | Admitting: Gynecology

## 2019-09-05 ENCOUNTER — Telehealth: Payer: Self-pay | Admitting: *Deleted

## 2019-09-05 ENCOUNTER — Other Ambulatory Visit: Payer: Self-pay

## 2019-09-05 MED ORDER — ORTHO-NOVUM 1/35 (28) 1-35 MG-MCG PO TABS: 1.0000 | ORAL_TABLET | Freq: Every day | ORAL | 0 refills | Status: DC

## 2019-09-05 NOTE — Telephone Encounter (Signed)
This refill encounter was not needed as was handled earlier by telephone call with patient.

## 2019-09-05 NOTE — Telephone Encounter (Signed)
As long as she understands the risks with the higher estrogen level, and that this is a real risk, then she can refill the ortho novum for another 3 months, then she should check in with [place] by message to see how she is doing.

## 2019-09-05 NOTE — Telephone Encounter (Signed)
Per Dr.Fontaine note on 06/12/19 "  irregular bleeding on low-dose oral contraceptives, recommend for now increasing her oral contraceptive to 1/35 and see how she does with this.  Risk of thrombosis to include stroke heart attack DVT with increased estrogen.  We will do this for 3 months assuming she has control of her cycle and then go back down to the 1/20.  Patient called today requesting refill, I explained the above to her and she would like to stay on higher dose Ortho-Novum 1/35 mcg tablet. Patient has not had any irregular bleeding with this pill. Okay to refill ortho-novum until annual exam?

## 2019-09-05 NOTE — Telephone Encounter (Signed)
Patient informed with all below, Rx sent for 3 pack only.

## 2019-09-10 ENCOUNTER — Other Ambulatory Visit: Payer: Self-pay | Admitting: Obstetrics and Gynecology

## 2019-09-10 DIAGNOSIS — Z1231 Encounter for screening mammogram for malignant neoplasm of breast: Secondary | ICD-10-CM

## 2019-10-06 ENCOUNTER — Other Ambulatory Visit: Payer: Self-pay

## 2019-10-06 ENCOUNTER — Ambulatory Visit
Admission: RE | Admit: 2019-10-06 | Discharge: 2019-10-06 | Disposition: A | Discharge status: Home / Self Care | Payer: Managed Care, Other (non HMO) | Source: Ambulatory Visit | Attending: Obstetrics and Gynecology | Admitting: Obstetrics and Gynecology

## 2019-10-06 DIAGNOSIS — Z1231 Encounter for screening mammogram for malignant neoplasm of breast: Secondary | ICD-10-CM

## 2019-11-10 ENCOUNTER — Encounter: Payer: Managed Care, Other (non HMO) | Admitting: Obstetrics and Gynecology

## 2019-11-10 ENCOUNTER — Other Ambulatory Visit: Payer: Self-pay

## 2019-11-11 ENCOUNTER — Encounter: Payer: Self-pay | Admitting: Obstetrics and Gynecology

## 2019-11-11 ENCOUNTER — Ambulatory Visit (INDEPENDENT_AMBULATORY_CARE_PROVIDER_SITE_OTHER): Payer: Managed Care, Other (non HMO) | Admitting: Obstetrics and Gynecology

## 2019-11-11 VITALS — BP 122/78 | Ht 63.0 in | Wt 140.0 lb

## 2019-11-11 DIAGNOSIS — Z1329 Encounter for screening for other suspected endocrine disorder: Secondary | ICD-10-CM | POA: Diagnosis not present

## 2019-11-11 DIAGNOSIS — Z13220 Encounter for screening for lipoid disorders: Secondary | ICD-10-CM | POA: Diagnosis not present

## 2019-11-11 DIAGNOSIS — N921 Excessive and frequent menstruation with irregular cycle: Secondary | ICD-10-CM | POA: Diagnosis not present

## 2019-11-11 DIAGNOSIS — Z01419 Encounter for gynecological examination (general) (routine) without abnormal findings: Secondary | ICD-10-CM

## 2019-11-11 MED ORDER — ORTHO-NOVUM 1/35 (28) 1-35 MG-MCG PO TABS: 1.0000 | ORAL_TABLET | Freq: Every day | ORAL | 4 refills | Status: DC

## 2019-11-11 NOTE — Progress Notes (Signed)
Marilyn Gibson June 30, 1971 [id number]  SUBJECTIVE:  49 y.o. [id number] female for annual routine gynecologic exam.  She did have a hysteroscopy D&C for benign endometrial polyps in May 2020.  She has been maintained on Ortho-Novum for heavy menstrual periods which she feels has mostly helped control her bleeding.  She did have irregular bleeding in between menstrual periods lower dose OCPs.  She has no gynecologic concerns.  Current Outpatient Medications  Medication Sig Dispense Refill  . ALBUTEROL IN Inhale 2 puffs into the lungs daily as needed. For shortness of breath.    . Beclomethasone Dipropionate (QVAR IN) Inhale into the lungs every evening.     . cetirizine (ZYRTEC) 10 MG tablet Take 10 mg by mouth every morning.     Marland Kitchen losartan-hydrochlorothiazide (HYZAAR) 100-25 MG tablet Take 1 tablet by mouth every evening.     . Multiple Vitamin (MULTIVITAMIN WITH MINERALS) TABS Take 1 tablet by mouth every morning.     . norethindrone-ethinyl estradiol 1/35 (Moore 1/35, 28,) tablet Take 1 tablet by mouth daily. 3 Package 0   No current facility-administered medications for this visit.   Allergies: Penicillins  No LMP recorded. (Menstrual status: Oral contraceptives).  Past medical history,surgical history, problem list, medications, allergies, family history and social history were all reviewed and documented as reviewed in the EPIC chart.  ROS:  Feeling well. No dyspnea or chest pain on exertion.  No abdominal pain, change in bowel habits, black or bloody stools.  No urinary tract symptoms. GYN ROS: normal menses, no abnormal bleeding, pelvic pain or discharge, no breast pain or new or enlarging lumps on self exam. No neurological complaints.   OBJECTIVE:  BP 122/78   Ht 5\' 3"  (1.6 m)   Wt 140 lb (63.5 kg)   BMI 24.80 kg/m  The patient appears well, alert, oriented x 3, in no distress. ENT normal.  Neck supple. No cervical or supraclavicular adenopathy or thyromegaly.  Lungs are  clear, good air entry, no wheezes, rhonchi or rales. S1 and S2 normal, no murmurs, regular rate and rhythm.  Abdomen soft without tenderness, guarding, mass or organomegaly.  Neurological is normal, no focal findings.  BREAST EXAM: breasts appear normal, no suspicious masses, no skin or nipple changes or axillary nodes  PELVIC EXAM: VULVA: normal appearing vulva with no masses, tenderness or lesions, VAGINA: normal appearing vagina with normal color and discharge, no lesions, CERVIX: normal appearing cervix without discharge or lesions, UTERUS: uterus is normal size, shape, consistency and nontender, ADNEXA: normal adnexa in size, nontender and no masses  Chaperone: Caryn Bee present during the examination  ASSESSMENT:  49 y.o. [id number] here for annual gynecologic exam  PLAN:   1.  Perimenopausal with menorrhagia.  She has had heavy and irregular periods but somewhat controlled on Ortho-Novum, and she does request a refill which is granted.  No medical contraindications.  She did have benign endometrial polyps removed by hysteroscopy D&C about 1 year ago.  She would like to consider weaning off of hormones, especially as she has required a higher dose OCP, and she expresses possible interest in endometrial ablation.  She has not done well with the Mirena IUD in the past due to side effects including headache.  She is provided with the ACOG brochure on endometrial ablation and I instructed her make a follow-up appoint with me to further discuss the procedure. 2. Pap smear/HPV 11/2018.  No significant history of abnormal Pap smears.  Next Pap smear due 2025 following the current  guidelines recommending the 5 year interval. 3. Contraception.  History of tubal sterilization. 4.  Mammogram 10/2019.  Breast exam normal.  Continue annual mammograms. 5. Health maintenance.  She will proceed to lab today for routine screening blood work (lipids, CBC, CMP, TSH).  Return annually or sooner, prn.  Joseph Pierini MD 11/11/19

## 2019-11-12 LAB — LIPID PANEL
Chol/HDL Ratio: 3.3 ratio (ref 0.0–4.4)
Cholesterol, Total: 219 mg/dL — ABNORMAL HIGH (ref 100–199)
HDL: 67 mg/dL (ref >39)
LDL Chol Calc (NIH): 143 mg/dL — ABNORMAL HIGH (ref 0–99)
Triglycerides: 51 mg/dL (ref 0–149)
VLDL Cholesterol Cal: 9 mg/dL (ref 5–40)

## 2019-11-12 LAB — CBC
Hematocrit: 39.5 % (ref 34.0–46.6)
Hemoglobin: 13 g/dL (ref 11.1–15.9)
MCH: 30.7 pg (ref 26.6–33.0)
MCHC: 32.9 g/dL (ref 31.5–35.7)
MCV: 93 fL (ref 79–97)
Platelets: 345 10*3/uL (ref 150–450)
RBC: 4.24 x10E6/uL (ref 3.77–5.28)
RDW: 13 % (ref 11.7–15.4)
WBC: 4.7 10*3/uL (ref 3.4–10.8)

## 2019-11-12 LAB — COMPREHENSIVE METABOLIC PANEL
ALT: 10 IU/L (ref 0–32)
AST: 11 IU/L (ref 0–40)
Albumin/Globulin Ratio: 1.3 (ref 1.2–2.2)
Albumin: 4.1 g/dL (ref 3.8–4.8)
Alkaline Phosphatase: 32 IU/L — ABNORMAL LOW (ref 39–117)
BUN/Creatinine Ratio: 9 (ref 9–23)
BUN: 9 mg/dL (ref 6–24)
Bilirubin Total: 0.2 mg/dL (ref 0.0–1.2)
CO2: 25 mmol/L (ref 20–29)
Calcium: 9.3 mg/dL (ref 8.7–10.2)
Chloride: 100 mmol/L (ref 96–106)
Creatinine, Ser: 1.05 mg/dL — ABNORMAL HIGH (ref 0.57–1.00)
GFR calc Af Amer: 73 mL/min/{1.73_m2} (ref >59)
GFR calc non Af Amer: 63 mL/min/{1.73_m2} (ref >59)
Globulin, Total: 3.2 g/dL (ref 1.5–4.5)
Glucose: 70 mg/dL (ref 65–99)
Potassium: 4.4 mmol/L (ref 3.5–5.2)
Sodium: 137 mmol/L (ref 134–144)
Total Protein: 7.3 g/dL (ref 6.0–8.5)

## 2019-11-12 LAB — TSH: TSH: 1.4 u[IU]/mL (ref 0.450–4.500)

## 2020-10-27 ENCOUNTER — Other Ambulatory Visit: Payer: Self-pay | Admitting: Obstetrics and Gynecology

## 2020-10-27 DIAGNOSIS — Z1231 Encounter for screening mammogram for malignant neoplasm of breast: Secondary | ICD-10-CM

## 2020-12-16 NOTE — Progress Notes (Signed)
50 y.o. [id number] Married Black or [place] American female here for annual exam.    Period Pattern:  (no cycle with birth control) No LMP recorded (exact date). (Menstrual status: Oral contraceptives).          Has not bled in over a year but is so afraid to bleed again is very concerned about coming off pill. Taking OCP continuously. Warned about risk of stroke with HTN , age and OCP with estrogen.  Discussed a compromise of changing to norethindrone 0.35mg   Went to [facility] and had some blood work Was started on Atorvastatin so she stopped taking for muscle aches They did not check her Vitamin D level. She would like that test today and is not worried about insurance covering it. She feels tired and want to know if has Vit D deficiency.  Sexually active: Yes.    The current method of family planning is tubes removed.    Exercising: Yes.     walking Smoker:  no  Health Maintenance: Pap:  11-07-2018 neg HPV HR neg History of abnormal Pap:  no MMG: today, scheduled for 2 pm Colonoscopy:  none, has a referral for GI  BMD:   none Gardasil:   n/a Covid-19: pfizer Hep C testing: not done Screening Labs: wants Vitamin D drawn   reports that she has never smoked. She has never used smokeless tobacco. She reports previous alcohol use. She reports that she does not use drugs.  Past Medical History:  Diagnosis Date   Anemia    Asthma    Bronchitis    Cervical dysplasia    as teenager, normal since   Complication of anesthesia    woke up during D&E, was given more anesthesia caused her "loopiness" for 3-5 days after   Endometrial polyp    GERD (gastroesophageal reflux disease)    Grave's disease    dx w/ 1st child 2002 but then after delivery, graves dis went away   Headache(784.0)    tx w lexapro   Heart murmur    as a child, no problem as adult   History of migraines    Hyperlipidemia    no meds   Hypertension    Menorrhagia    Pneumonia    Walking pneumonia    Seasonal allergies    Vitamin D deficiency    Wears glasses     Past Surgical History:  Procedure Laterality Date   BILATERAL SALPINGECTOMY  07/04/2012   Procedure: BILATERAL SALPINGECTOMY;  Surgeon: Anastasio Auerbach, MD;  Location: [facility];  Service: Gynecology;  Laterality: Bilateral;   BREAST BIOPSY Right 2014   BREAST EXCISIONAL BIOPSY Right 2014   BREAST SURGERY     Benign breast lump excised   DILATATION & CURETTAGE/HYSTEROSCOPY WITH MYOSURE N/A 11/15/2018   Procedure: Scre[place];  Surgeon: Anastasio Auerbach, MD;  Location: [facility];  Service: Gynecology;  Laterality: N/A;   dilation and evacution  02/2004   loss at 19 weeks, mom pos. for parvovirus   LAPAROSCOPY  07/04/2012   Procedure: LAPAROSCOPY OPERATIVE;  Surgeon: Anastasio Auerbach, MD;  Location: [facility];  Service: Gynecology;  Laterality: N/A;   Moles removed     WISDOM TOOTH EXTRACTION      Current Outpatient Medications  Medication Sig Dispense Refill   Beclomethasone Dipropionate (QVAR IN) Inhale into the lungs every evening.      cetirizine (ZYRTEC) 10 MG tablet Take 10 mg by mouth every morning.  Esomeprazole Magnesium (NEXIUM PO) Take by mouth.     Multiple Vitamin (MULTIVITAMIN WITH MINERALS) TABS Take 1 tablet by mouth every morning.      norethindrone-ethinyl estradiol (CYCLAFEM) 0.5/0.75/1-35 MG-MCG tablet A[place]acen 1/35 (28) 1 mg-35 mcg tablet  TAKE 1 TABLET BY MOUTH EVERY DAY     olmesartan (BENICAR) 40 MG tablet Take 40 mg by mouth daily.     No current facility-administered medications for this visit.    Family History  Problem Relation Age of Onset   Cancer Other        colon   Hypertension Mother    Diabetes Father    Cancer Father        Lymphoma    Review of Systems  Constitutional: Negative.   HENT: Negative.    Eyes: Negative.   Respiratory: Negative.    Cardiovascular: Negative.   Gastrointestinal: Negative.   Endocrine:  Negative.   Genitourinary: Negative.   Musculoskeletal: Negative.   Skin: Negative.   Allergic/Immunologic: Negative.   Neurological: Negative.   Hematological: Negative.   Psychiatric/Behavioral: Negative.     Exam:   BP 120/80   Pulse 62   Resp 16   Ht 5' 2.75" (1.594 m)   Wt 141 lb (64 kg)   LMP  (Exact Date)   BMI 25.18 kg/m   Height: 5' 2.75" (159.4 cm)  General appearance: alert, cooperative and appears stated age, no acute distress Head: Normocephalic, without obvious abnormality Neck: no adenopathy, thyroid normal to inspection and palpation Lungs: clear to auscultation bilaterally Breasts: normal appearance, no masses or tenderness Heart: regular rate and rhythm Abdomen: soft, non-tender; no masses,  no organomegaly Extremities: extremities normal, no edema Skin: No rashes or lesions Lymph nodes: Cervical, supraclavicular, and axillary nodes normal. No abnormal inguinal nodes palpated Neurologic: Grossly normal   Pelvic: External genitalia:  no lesions              Urethra:  normal appearing urethra with no masses, tenderness or lesions              Bartholins and Skenes: normal                 Vagina: normal appearing vagina, appropriate for age, normal appearing discharge, no lesions              Cervix: neg cervical motion tenderness, no visible lesions, scant amount blood at os             Bimanual Exam:   Uterus:  normal size, contour, position, consistency, mobility, non-tender              Adnexa: normal adnexa                 Joy, CMA Chaperone was present for exam.  A:  Well woman exam with routine gynecological exam - Plan: VITAMIN D 25 Hydroxy (Vit-D Deficiency, Fractures), VITAMIN D 25 Hydroxy (Vit-D Deficiency, Fractures)  Surveillance of previously prescribed contraceptive pill - Plan: norethindrone (MICRONOR) 0.35 MG tablet  Fatigue, unspecified type - Plan: VITAMIN D 25 Hydroxy (Vit-D Deficiency, Fractures), VITAMIN D 25 Hydroxy (Vit-D  Deficiency, Fractures)   P:   Pap : due 2025  Mammogram: pt has appointment today  Labs: Vit D, [facility]  Medications: D/C OCP with estrogen, change to Micronor  (Used to manage heavy menstrual bleeding since she has a history)  Pt no longer candidate for estrogen containing OCP r/t HTN  Advised  patient to notify PCP she can not tolerate Atorvastatin, consider asking provider about Red Yeast Rice. Information provided.  Pt notified that there was a scant amount of blood noted at cervix when speculum exam done. She stated that she notices a very small amount of blood from time to time but not enough to call a period. Advised that she is not menopausal    F/U 1 year, sooner if needed

## 2020-12-17 ENCOUNTER — Other Ambulatory Visit: Payer: Self-pay

## 2020-12-17 ENCOUNTER — Ambulatory Visit
Admission: RE | Admit: 2020-12-17 | Discharge: 2020-12-17 | Disposition: A | Discharge status: Home / Self Care | Payer: Managed Care, Other (non HMO) | Source: Ambulatory Visit | Attending: Obstetrics and Gynecology | Admitting: Obstetrics and Gynecology

## 2020-12-17 ENCOUNTER — Encounter: Payer: Self-pay | Admitting: Nurse Practitioner

## 2020-12-17 ENCOUNTER — Ambulatory Visit (INDEPENDENT_AMBULATORY_CARE_PROVIDER_SITE_OTHER): Payer: Managed Care, Other (non HMO) | Admitting: Nurse Practitioner

## 2020-12-17 VITALS — BP 120/80 | HR 62 | Resp 16 | Ht 62.75 in | Wt 141.0 lb

## 2020-12-17 DIAGNOSIS — Z3041 Encounter for surveillance of contraceptive pills: Secondary | ICD-10-CM | POA: Diagnosis not present

## 2020-12-17 DIAGNOSIS — Z01419 Encounter for gynecological examination (general) (routine) without abnormal findings: Secondary | ICD-10-CM | POA: Diagnosis not present

## 2020-12-17 DIAGNOSIS — Z1231 Encounter for screening mammogram for malignant neoplasm of breast: Secondary | ICD-10-CM

## 2020-12-17 DIAGNOSIS — R5383 Other fatigue: Secondary | ICD-10-CM | POA: Diagnosis not present

## 2020-12-17 MED ORDER — NORETHINDRONE 0.35 MG PO TABS: 1.0000 | ORAL_TABLET | Freq: Every day | ORAL | 4 refills | Status: DC

## 2020-12-17 NOTE — Patient Instructions (Signed)
Red Yeast Rice capsules What is this medication? RED YEAST RICE (red yeest rahys) is intended to be used by healthy adults to help lower blood cholesterol in conjunction with a healthy diet and a regular exercise program. The FDA has not approved this supplement for any medical use. If medical treatment is needed for cholesterol control or any other disease, you should contact your doctor or health care professional regarding the use ofthis product. This supplement may be used for other purposes; ask your health care provideror pharmacist if you have questions. This medicine may be used for other purposes; ask your health care provider orpharmacist if you have questions. COMMON BRAND NAME(S): Red Yeast Rice What should I tell my care team before I take this medication? They need to know if you have any of these conditions: frequently drink alcoholic beverages kidney disease liver disease muscle aches or weakness other medical condition an unusual or allergic reaction to red yeast rice, went yeast, lovastatin, other 'statin' medications, other medicines, foods, dyes, or preservatives pregnant or trying to get pregnant breast-feeding How should I use this medication? Take this supplement by mouth with a glass of water. Follow the directions on the package labeling, or take as directed by your health care professional. Meliton Rattan take this supplement more often than directed. Contact your pediatrician or health care professional regarding the use of this supplement in children. Special care may be needed. This supplement is notrecommended for use in children. Overdosage: If you think you have taken too much of this medicine contact apoison control center or emergency room at once. NOTE: This medicine is only for you. Do not share this medicine with others. What if I miss a dose? If you miss a dose, take it as soon as you can. If it is almost time for yournext dose, take only that dose. Do not take  double or extra doses. What may interact with this medication? Do not take this medicine with any of the following medications: clarithromycin delavirdine erythromycin grapefruit juice protease inhibitors used to treat HIV infection medicines for fungal infections like itraconazole, ketoconazole, posaconazole, and voriconazole mibefradil nefazodone other medicines for high cholesterol telithromycin troleandomycin This medicine may also interact with the following medications: alcohol amiodarone colchicine cyclosporine danazol diltiazem fenofibrate fluconazole gemfibrozil mifepristone, RU-486 niacin St. John's wort verapamil voriconazole warfarin This list may not describe all possible interactions. Give your health care provider a list of all the medicines, herbs, non-prescription drugs, or dietary supplements you use. Also tell them if you smoke, drink alcohol, or use illegaldrugs. Some items may interact with your medicine. What should I watch for while using this medication? Visit your doctor or health care professional for regular check-ups. You mayneed regular tests to make sure your liver is working properly. Tell you doctor or health care professional right away if you get any unexplained muscle pain, tenderness, or weakness, especially if you also have afever and tiredness. Some drugs may increase the risk of side effects from this supplement. If you are given certain antibiotics or antifungals, you should stop taking this supplement during those treatments. Check with your doctor or pharmacist foradvice. If you are scheduled for any medical or dental procedure, tell your healthcare provider that you are taking this supplement. You may need to stop taking thissupplement before the procedure. Do not use this drug if you are pregnant or breast-feeding. Serious side effects to an unborn child or to an infant are possible. Talk to your doctor orpharmacist for more  information. Herbal or dietary supplements are not regulated like medicines. Rigid quality control standards are not required for dietary supplements. The purity and strength of these products can vary. The safety and effect of this dietary supplement for a certain disease or illness is not well known. This product isnot intended to diagnose, treat, cure or prevent any disease. The Food and Drug Administration suggests the following to help consumersprotect themselves: Always read product labels and follow directions. Natural does not mean a product is safe for humans to take. Look for products that include USP after the ingredient name. This means that the manufacturer followed the standards of the [place] Pharmacopoeia. Supplements made or sold by a nationally known food or drug company are more likely to be made under tight controls. You can write to the company for more information about how the product was made. What side effects may I notice from receiving this medication? Side effects that you should report to your doctor or health care professionalas soon as possible: allergic reactions like skin rash, itching or hives, swelling of the face, lips, or tongue dark urine fever joint pain muscle cramps, pain redness, blistering, peeling or loosening of the skin, including inside the mouth trouble passing urine or change in the amount of urine unusually weak or tired yellowing of the eyes or skin Side effects that usually do not require medical attention (report to yourdoctor or health care professional if they continue or are bothersome): constipation headache stomach gas, pain, upset nausea trouble sleeping This list may not describe all possible side effects. Call your doctor for medical advice about side effects. You may report side effects to FDA at1-800-FDA-1088. Where should I keep my medication? Keep out of the reach of children. Store at room temperature between 15 and 30 degrees C  (59 and 86 degrees F).Throw away any unused medicine after the expiration date. NOTE: This sheet is a summary. It may not cover all possible information. If you have questions about this medicine, talk to your doctor, pharmacist, orhealth care provider.  2022 Elsevier/Gold Standard (2014-02-24 10:26:14)

## 2021-01-24 ENCOUNTER — Encounter: Payer: Self-pay | Admitting: Gastroenterology

## 2021-02-08 ENCOUNTER — Other Ambulatory Visit: Payer: Self-pay

## 2021-02-08 ENCOUNTER — Ambulatory Visit (AMBULATORY_SURGERY_CENTER): Payer: Managed Care, Other (non HMO)

## 2021-02-08 VITALS — Ht 62.75 in | Wt 137.0 lb

## 2021-02-08 DIAGNOSIS — Z1211 Encounter for screening for malignant neoplasm of colon: Secondary | ICD-10-CM

## 2021-02-08 MED ORDER — PEG-KCL-NACL-NASULF-NA ASC-C 100 G PO SOLR: 1.0000 | Freq: Once | ORAL | 0 refills | Status: AC

## 2021-02-08 NOTE — Progress Notes (Signed)
Patient's pre-visit was done today over the phone with the patient Name,DOB and address verified.  Patient denies any allergies to Eggs and Soy. Patient denies any problems with anesthesia/sedation. Patient denies taking diet pills or blood thinners. No home Oxygen. Packet of Prep instructions mailed to patient including a copy of a consent form-pt is aware. Patient understands to call us back with any questions or concerns. Patient is aware of our care-partner policy and 0000000 safety protocol.   EMMI education assigned to the patient for the procedure, sent to [facility].   The patient is COVID-19 vaccinated.      Pt in the past has said she has had nausea with anethesia-discussed that proprolol is not generally associated with this but also asked pt to mention to nurse anesthetist.

## 2021-02-18 ENCOUNTER — Encounter: Payer: Self-pay | Admitting: Gastroenterology

## 2021-02-22 ENCOUNTER — Ambulatory Visit (AMBULATORY_SURGERY_CENTER): Payer: Managed Care, Other (non HMO) | Admitting: Gastroenterology

## 2021-02-22 ENCOUNTER — Encounter: Payer: Self-pay | Admitting: Gastroenterology

## 2021-02-22 ENCOUNTER — Other Ambulatory Visit: Payer: Self-pay

## 2021-02-22 VITALS — BP 137/80 | HR 57 | Temp 98.9°F | Resp 14 | Ht 62.75 in | Wt 137.0 lb

## 2021-02-22 DIAGNOSIS — D123 Benign neoplasm of transverse colon: Secondary | ICD-10-CM

## 2021-02-22 DIAGNOSIS — Z1211 Encounter for screening for malignant neoplasm of colon: Secondary | ICD-10-CM | POA: Diagnosis present

## 2021-02-22 DIAGNOSIS — K635 Polyp of colon: Secondary | ICD-10-CM

## 2021-02-22 HISTORY — PX: COLONOSCOPY: SHX174

## 2021-02-22 MED ORDER — SODIUM CHLORIDE 0.9 % IV SOLN: 500.0000 mL | Freq: Once | INTRAVENOUS | Status: DC

## 2021-02-22 NOTE — Progress Notes (Signed)
PT taken to PACU. Monitors in place. VSS. Report given to RN. 

## 2021-02-22 NOTE — Progress Notes (Signed)
History & Physical  Primary Care Physician:  Kelton Pillar, MD Primary Gastroenterologist: Jerilynn Mages. Fuller Plan, MD  CHIEF COMPLAINT:  CRC screening, average risk.  HPI: Marilyn Gibson is a 50 y.o. female who presents for colonoscopy for CRC screening, average risk.  This is her first colonoscopy.  No gastrointestinal complaints.   Past Medical History:  Diagnosis Date   Allergy    Anemia    Anxiety    Asthma    Bronchitis    Cervical dysplasia    as teenager, normal since   Complication of anesthesia    woke up during D&E, was given more anesthesia caused her "loopiness" for 3-5 days after   Endometrial polyp    GERD (gastroesophageal reflux disease)    Grave's disease    dx w/ 1st child 2002 but then after delivery, graves dis went away   Headache(784.0)    tx w lexapro   Heart murmur    as a child, no problem as adult   History of migraines    Hyperlipidemia    no meds   Hypertension    Menorrhagia    Pneumonia    Walking pneumonia   Seasonal allergies    seasonal   Vitamin D deficiency    Wears glasses     Past Surgical History:  Procedure Laterality Date   BILATERAL SALPINGECTOMY  07/04/2012   Procedure: BILATERAL SALPINGECTOMY;  Surgeon: Anastasio Auerbach, MD;  Location: [facility];  Service: Gynecology;  Laterality: Bilateral;   BREAST BIOPSY Right 2014   BREAST EXCISIONAL BIOPSY Right 2014   BREAST SURGERY     Benign breast lump excised   COLONOSCOPY  02/22/2021   DILATATION & CURETTAGE/HYSTEROSCOPY WITH MYOSURE N/A 11/15/2018   Procedure: [place];  Surgeon: Anastasio Auerbach, MD;  Location: [facility];  Service: Gynecology;  Laterality: N/A;   dilation and evacution  02/2004   loss at 19 weeks, mom pos. for parvovirus   LAPAROSCOPY  07/04/2012   Procedure: LAPAROSCOPY OPERATIVE;  Surgeon: Anastasio Auerbach, MD;  Location: [facility];  Service: Gynecology;  Laterality: N/A;   Moles removed     WISDOM TOOTH  EXTRACTION      Prior to Admission medications   Medication Sig Start Date End Date Taking? Authorizing Provider  ALAYCEN 1/35 tablet Take 1 tablet by mouth daily. 01/21/21  Yes [provider]  ALPRAZolam Duanne Moron) 0.5 MG tablet Take 0.5 mg by mouth 4 (four) times daily as needed. 01/05/21  Yes [provider]  amLODipine (NORVASC) 5 MG tablet amlodipine 5 mg tablet   Yes [provider]  Atorvastatin Calcium (LIPITOR PO) Take by mouth.   Yes [provider]  cetirizine (ZYRTEC) 10 MG tablet Take 10 mg by mouth every morning.    Yes [provider]  Esomeprazole Magnesium (NEXIUM PO) Take by mouth.   Yes [provider]  fluticasone (FLONASE) 50 MCG/ACT nasal spray Place 1 spray into both nostrils 2 (two) times daily. 02/18/21  Yes [provider]  Multiple Vitamin (MULTIVITAMIN WITH MINERALS) TABS Take 1 tablet by mouth every morning.    Yes [provider]  olmesartan (BENICAR) 40 MG tablet Take 40 mg by mouth daily. 12/10/20  Yes [provider]  albuterol (VENTOLIN HFA) 108 (90 Base) MCG/ACT inhaler Inhale into the lungs every 6 (six) hours as needed for wheezing or shortness of breath.    [provider]  Beclomethasone Dipropionate (QVAR IN) Inhale into the  lungs every evening.  Patient not taking: No sig reported    [provider]    Current Outpatient Medications  Medication Sig Dispense Refill   ALAYCEN 1/35 tablet Take 1 tablet by mouth daily.     ALPRAZolam (XANAX) 0.5 MG tablet Take 0.5 mg by mouth 4 (four) times daily as needed.     amLODipine (NORVASC) 5 MG tablet amlodipine 5 mg tablet     Atorvastatin Calcium (LIPITOR PO) Take by mouth.     cetirizine (ZYRTEC) 10 MG tablet Take 10 mg by mouth every morning.      Esomeprazole Magnesium (NEXIUM PO) Take by mouth.     fluticasone (FLONASE) 50 MCG/ACT nasal spray Place 1 spray into both nostrils 2 (two) times daily.     Multiple  Vitamin (MULTIVITAMIN WITH MINERALS) TABS Take 1 tablet by mouth every morning.      olmesartan (BENICAR) 40 MG tablet Take 40 mg by mouth daily.     albuterol (VENTOLIN HFA) 108 (90 Base) MCG/ACT inhaler Inhale into the lungs every 6 (six) hours as needed for wheezing or shortness of breath.     Beclomethasone Dipropionate (QVAR IN) Inhale into the lungs every evening.  (Patient not taking: No sig reported)     Current Facility-Administered Medications  Medication Dose Route Frequency Provider Last Rate Last Admin   0.9 %  sodium chloride infusion  500 mL Intravenous Once Ladene Artist, MD        Allergies as of 02/22/2021 - Review Complete 02/22/2021  Allergen Reaction Noted   Penicillins Hives and Swelling 01/06/2011    Family History  Problem Relation Age of Onset   Hypertension Mother    Diabetes Father    Cancer Father        Lymphoma   Cancer Other        colon   Colon cancer Neg Hx    Colon polyps Neg Hx    Esophageal cancer Neg Hx    Stomach cancer Neg Hx    Rectal cancer Neg Hx     Social History   Socioeconomic History   Marital status: Married    Spouse name: Not on file   Number of children: Not on file   Years of education: Not on file   Highest education level: Not on file  Occupational History   Not on file  Tobacco Use   Smoking status: Never   Smokeless tobacco: Never  Vaping Use   Vaping Use: Never used  Substance and Sexual Activity   Alcohol use: Not Currently   Drug use: No   Sexual activity: Yes    Partners: Male    Birth control/protection: Surgical    Comment: tubes removed  Other Topics Concern   Not on file  Social History Narrative   Not on file   Social Determinants of Health   Financial Resource Strain: Not on file  Food Insecurity: Not on file  Transportation Needs: Not on file  Physical Activity: Not on file  Stress: Not on file  Social Connections: Not on file  Intimate Partner Violence: Not on file    Review of  Systems:  All systems reviewed an negative except where noted in HPI.  Gen: Denies any fever, chills, sweats, anorexia, fatigue, weakness, malaise, weight loss, and sleep disorder CV: Denies chest pain, angina, palpitations, syncope, orthopnea, PND, peripheral edema, and claudication. Resp: Denies dyspnea at rest, dyspnea with exercise, cough, sputum, wheezing, coughing up blood, and pleurisy. GI: Denies  vomiting blood, jaundice, and fecal incontinence.   Denies dysphagia or odynophagia. GU : Denies urinary burning, blood in urine, urinary frequency, urinary hesitancy, nocturnal urination, and urinary incontinence. MS: Denies joint pain, limitation of movement, and swelling, stiffness, low back pain, extremity pain. Denies muscle weakness, cramps, atrophy.  Derm: Denies rash, itching, dry skin, hives, moles, warts, or unhealing ulcers.  Psych: Denies depression, anxiety, memory loss, suicidal ideation, hallucinations, paranoia, and confusion. Heme: Denies bruising, bleeding, and enlarged lymph nodes. Neuro:  Denies any headaches, dizziness, paresthesias. Endo:  Denies any problems with DM, thyroid, adrenal function.  Physical Exam: Vital signs in last 24 hours: '@VSRANGES'$ @   General:  Alert, well-developed, in NAD Head:  Normocephalic and atraumatic. Eyes:  Sclera clear, no icterus.   Conjunctiva pink. Ears:  Normal auditory acuity. Mouth:  No deformity or lesions.  Neck:  Supple; no masses . Lungs:  Clear throughout to auscultation.   No wheezes, crackles, or rhonchi. No acute distress. Heart:  Regular rate and rhythm; no murmurs. Abdomen:  Soft, nondistended, nontender. No masses, hepatomegaly. No obvious masses.  Normal bowel .    Rectal:  Deferred   Msk:  Symmetrical without gross deformities.. Pulses:  Normal pulses noted. Extremities:  Without edema. Neurologic:  Alert and  oriented x4;  grossly normal neurologically. Skin:  Intact without significant lesions or  rashes. Cervical Nodes:  No significant cervical adenopathy. Psych:  Alert and cooperative. Normal mood and affect.   Impression / Plan:   CRC screening, average risk for colonoscopy. The risks (including bleeding, perforation, infection, missed lesions, medication reactions and possible hospitalization or surgery if complications occur), benefits, and alternatives to colonoscopy with possible biopsy and possible polypectomy were discussed with the patient and they consent to proceed.     This patient is appropriate for endoscopic procedures in the ambulatory setting.    Pricilla Riffle. Fuller Plan  02/22/2021, 11:44 AM

## 2021-02-22 NOTE — Op Note (Signed)
[facility] Patient Name: Marilyn Gibson Procedure Date: 02/22/2021 11:41 AM MRN: [id number] Endoscopist: Ladene Artist , MD Age: 50 Referring MD:  Date of Birth: 22-Feb-1971 Gender: Female Account #: [id number] Procedure:                Colonoscopy Indications:              Screening for colorectal malignant neoplasm Medicines:                Monitored Anesthesia Care Procedure:                Pre-Anesthesia Assessment:                           - Prior to the procedure, a History and Physical                            was performed, and patient medications and                            allergies were reviewed. The patient's tolerance of                            previous anesthesia was also reviewed. The risks                            and benefits of the procedure and the sedation                            options and risks were discussed with the patient.                            All questions were answered, and informed consent                            was obtained. Prior Anticoagulants: The patient has                            taken no previous anticoagulant or antiplatelet                            agents. ASA Grade Assessment: II - A patient with                            mild systemic disease. After reviewing the risks                            and benefits, the patient was deemed in                            satisfactory condition to undergo the procedure.                           After obtaining informed consent, the colonoscope  was passed under direct vision. Throughout the                            procedure, the patient's blood pressure, pulse, and                            oxygen saturations were monitored continuously. The                            Olympus PCF-H190DL [id number]) Colonoscope was                            introduced through the anus and advanced to the the                            cecum,  identified by appendiceal orifice and                            ileocecal valve. The ileocecal valve, appendiceal                            orifice, and rectum were photographed. The quality                            of the bowel preparation was good. The colonoscopy                            was performed without difficulty. The patient                            tolerated the procedure well. Scope In: [id number] Scope Out: 12:12:19 PM Scope Withdrawal Time: 0 hours 11 minutes 5 seconds  Total Procedure Duration: 0 hours 15 minutes 35 seconds  Findings:                 The perianal and digital rectal examinations were                            normal.                           A 8 mm polyp was found in the transverse colon. The                            polyp was sessile. The polyp was removed with a                            cold snare. Resection and retrieval were complete.                           Internal hemorrhoids were found during                            retroflexion. The hemorrhoids were small and Grade  I (internal hemorrhoids that do not prolapse).                           The exam was otherwise without abnormality on                            direct and retroflexion views. Complications:            No immediate complications. Estimated blood loss:                            None. Estimated Blood Loss:     Estimated blood loss: none. Impression:               - One 8 mm polyp in the transverse colon, removed                            with a cold snare. Resected and retrieved.                           - Internal hemorrhoids.                           - The examination was otherwise normal on direct                            and retroflexion views. Recommendation:           - Repeat colonoscopy after studies are complete for                            surveillance based on pathology results.                           - Patient has a  contact number available for                            emergencies. The signs and symptoms of potential                            delayed complications were discussed with the                            patient. Return to normal activities tomorrow.                            Written discharge instructions were provided to the                            patient.                           - Resume previous diet.                           - Continue present medications.                           -  Await pathology results. Ladene Artist, MD 02/22/2021 12:14:23 PM This report has been signed electronically.

## 2021-02-22 NOTE — Progress Notes (Signed)
Called to room to assist during endoscopic procedure.  Patient ID and intended procedure confirmed with present staff. Received instructions for my participation in the procedure from the performing physician.  

## 2021-02-22 NOTE — Progress Notes (Signed)
Pt's states no medical or surgical changes since previsit or office visit.   VS [place]

## 2021-02-22 NOTE — Patient Instructions (Signed)
Handout given on polyps and hemorrhoids. Await pathology results. Can resume regular diet.    YOU HAD AN ENDOSCOPIC PROCEDURE TODAY AT [facility]:   Refer to the procedure report that was given to you for any specific questions about what was found during the examination.  If the procedure report does not answer your questions, please call your gastroenterologist to clarify.  If you requested that your care partner not be given the details of your procedure findings, then the procedure report has been included in a sealed envelope for you to review at your convenience later.  YOU SHOULD EXPECT: Some feelings of bloating in the abdomen. Passage of more gas than usual.  Walking can help get rid of the air that was put into your GI tract during the procedure and reduce the bloating. If you had a lower endoscopy (such as a colonoscopy or flexible sigmoidoscopy) you may notice spotting of blood in your stool or on the toilet paper. If you underwent a bowel prep for your procedure, you may not have a normal bowel movement for a few days.  Please Note:  You might notice some irritation and congestion in your nose or some drainage.  This is from the oxygen used during your procedure.  There is no need for concern and it should clear up in a day or so.  SYMPTOMS TO REPORT IMMEDIATELY:  Following lower endoscopy (colonoscopy or flexible sigmoidoscopy):  Excessive amounts of blood in the stool  Significant tenderness or worsening of abdominal pains  Swelling of the abdomen that is new, acute  Fever of 100F or higher  Following upper endoscopy (EGD)  Vomiting of blood or coffee ground material  New chest pain or pain under the shoulder blades  Painful or persistently difficult swallowing  New shortness of breath  Fever of 100F or higher  Black, tarry-looking stools  For urgent or emergent issues, a gastroenterologist can be reached at any hour by calling [phone]. Do not use  MyChart messaging for urgent concerns.    DIET:  We do recommend a small meal at first, but then you may proceed to your regular diet.  Drink plenty of fluids but you should avoid alcoholic beverages for 24 hours.  ACTIVITY:  You should plan to take it easy for the rest of today and you should NOT DRIVE or use heavy machinery until tomorrow (because of the sedation medicines used during the test).    FOLLOW UP: Our staff will call the number listed on your records 48-72 hours following your procedure to check on you and address any questions or concerns that you may have regarding the information given to you following your procedure. If we do not reach you, we will leave a message.  We will attempt to reach you two times.  During this call, we will ask if you have developed any symptoms of COVID 19. If you develop any symptoms (ie: fever, flu-like symptoms, shortness of breath, cough etc.) before then, please call [phone].  If you test positive for Covid 19 in the 2 weeks post procedure, please call and report this information to [place].    If any biopsies were taken you will be contacted by phone or by letter within the next 1-3 weeks.  Please call us at [phone] if you have not heard about the biopsies in 3 weeks.    SIGNATURES/CONFIDENTIALITY: You and/or your care partner have signed paperwork which will be entered into your electronic medical  record.  These signatures attest to the fact that that the information above on your After Visit Summary has been reviewed and is understood.  Full responsibility of the confidentiality of this discharge information lies with you and/or your care-partner. 

## 2021-02-24 ENCOUNTER — Telehealth: Payer: Self-pay

## 2021-02-24 NOTE — Telephone Encounter (Signed)
  Follow up Call-  Call back number 02/22/2021  Post procedure Call Back phone  # [phone]  Permission to leave phone message Yes  Some recent data might be hidden     Patient questions:  Do you have a fever, pain , or abdominal swelling? No. Pain Score  0 *  Have you tolerated food without any problems? Yes.    Have you been able to return to your normal activities? Yes.    Do you have any questions about your discharge instructions: Diet   No. Medications  No. Follow up visit  No.  Do you have questions or concerns about your Care? No.  Actions: * If pain score is 4 or above: No action needed, pain <4.  Have you developed a fever since your procedure? no  2.   Have you had an respiratory symptoms (SOB or cough) since your procedure? no  3.   Have you tested positive for COVID 19 since your procedure no  4.   Have you had any family members/close contacts diagnosed with the COVID 19 since your procedure?  no   If yes to any of these questions please route to Joylene John, RN and Joella Prince, RN

## 2021-03-09 ENCOUNTER — Encounter: Payer: Self-pay | Admitting: Gastroenterology

## 2021-11-25 ENCOUNTER — Other Ambulatory Visit: Payer: Self-pay | Admitting: Family Medicine

## 2021-11-25 DIAGNOSIS — Z1231 Encounter for screening mammogram for malignant neoplasm of breast: Secondary | ICD-10-CM

## 2021-12-20 ENCOUNTER — Ambulatory Visit
Admission: RE | Admit: 2021-12-20 | Discharge: 2021-12-20 | Disposition: A | Discharge status: Home / Self Care | Payer: Managed Care, Other (non HMO) | Source: Ambulatory Visit | Attending: Family Medicine | Admitting: Family Medicine

## 2021-12-20 DIAGNOSIS — Z1231 Encounter for screening mammogram for malignant neoplasm of breast: Secondary | ICD-10-CM

## 2022-01-16 ENCOUNTER — Ambulatory Visit (INDEPENDENT_AMBULATORY_CARE_PROVIDER_SITE_OTHER): Payer: Managed Care, Other (non HMO) | Admitting: Obstetrics & Gynecology

## 2022-01-16 ENCOUNTER — Other Ambulatory Visit (HOSPITAL_COMMUNITY)
Admission: RE | Admit: 2022-01-16 | Discharge: 2022-01-16 | Disposition: A | Discharge status: Home / Self Care | Payer: Managed Care, Other (non HMO) | Source: Ambulatory Visit | Attending: Obstetrics & Gynecology | Admitting: Obstetrics & Gynecology

## 2022-01-16 ENCOUNTER — Encounter: Payer: Self-pay | Admitting: Obstetrics & Gynecology

## 2022-01-16 VITALS — BP 124/82 | HR 63 | Ht 62.5 in | Wt 148.0 lb

## 2022-01-16 DIAGNOSIS — Z3041 Encounter for surveillance of contraceptive pills: Secondary | ICD-10-CM | POA: Diagnosis not present

## 2022-01-16 DIAGNOSIS — Z01419 Encounter for gynecological examination (general) (routine) without abnormal findings: Secondary | ICD-10-CM | POA: Insufficient documentation

## 2022-01-16 MED ORDER — NORETHIN ACE-ETH ESTRAD-FE 1-20 MG-MCG(24) PO TABS: 1.0000 | ORAL_TABLET | Freq: Every day | ORAL | 5 refills | Status: DC

## 2022-01-16 NOTE — Progress Notes (Signed)
Marilyn Gibson 06/28/1971 [id number]   History:    51 y.o. [id number] Married.  2 sons: oldest at [facility] [employer] in [place], youngest in [place].  RP:  Established patient presenting for annual gyn exam   HPI:  Well on BCPs Alaycen 1/35 continuously for menorrhagia.  No BTB.  Last year attempted the Progestin only BCPs, but was bleeding heavily most days.  H/O Bilateral Salpingectomy.  No pelvic pain.  H/O normal Paps.  Last Pap Neg in 11/2018.  Pap reflex today.  Breasts normal.  Mammo Neg 12/2021.  Colono 01/2021. BMI 26.64.  Health labs with Fam MD.   Past medical history,surgical history, family history and social history were all reviewed and documented in the EPIC chart.  Gynecologic History No LMP recorded. (Menstrual status: Oral contraceptives).  Obstetric History OB History  Gravida Para Term Preterm AB Living  '4 2 2   2 2  '$ SAB IAB Ectopic Multiple Live Births  2            # Outcome Date GA Lbr Len/2nd Weight Sex Delivery Anes PTL Lv  4 Term           3 Term           2 SAB           1 SAB              ROS: A ROS was performed and pertinent positives and negatives are included in the history.  GENERAL: No fevers or chills. HEENT: No change in vision, no earache, sore throat or sinus congestion. NECK: No pain or stiffness. CARDIOVASCULAR: No chest pain or pressure. No palpitations. PULMONARY: No shortness of breath, cough or wheeze. GASTROINTESTINAL: No abdominal pain, nausea, vomiting or diarrhea, melena or bright red blood per rectum. GENITOURINARY: No urinary frequency, urgency, hesitancy or dysuria. MUSCULOSKELETAL: No joint or muscle pain, no back pain, no recent trauma. DERMATOLOGIC: No rash, no itching, no lesions. ENDOCRINE: No polyuria, polydipsia, no heat or cold intolerance. No recent change in weight. HEMATOLOGICAL: No anemia or easy bruising or bleeding. NEUROLOGIC: No headache, seizures, numbness, tingling or weakness. PSYCHIATRIC: No depression, no loss of interest in  normal activity or change in sleep pattern.     Exam:   BP 124/82   Pulse 63   Ht 5' 2.5" (1.588 m)   Wt 148 lb (67.1 kg)   SpO2 95%   BMI 26.64 kg/m   Body mass index is 26.64 kg/m.  General appearance : Well developed well nourished female. No acute distress HEENT: Eyes: no retinal hemorrhage or exudates,  Neck supple, trachea midline, no carotid bruits, no thyroidmegaly Lungs: Clear to auscultation, no rhonchi or wheezes, or rib retractions  Heart: Regular rate and rhythm, no murmurs or gallops Breast:Examined in sitting and supine position were symmetrical in appearance, no palpable masses or tenderness,  no skin retraction, no nipple inversion, no nipple discharge, no skin discoloration, no axillary or supraclavicular lymphadenopathy Abdomen: no palpable masses or tenderness, no rebound or guarding Extremities: no edema or skin discoloration or tenderness  Pelvic: Vulva: Normal             Vagina: No gross lesions or discharge  Cervix: No gross lesions or discharge.  Pap reflex done.  Uterus  AV, normal size, shape and consistency, non-tender and mobile  Adnexa  Without masses or tenderness  Anus: Normal   Assessment/Plan:  51 y.o. female for annual exam   1. Encounter for routine gynecological examination  with Papanicolaou smear of cervix Well on BCPs Alaycen 1/35 continuously for menorrhagia.  No BTB.  Last year attempted the Progestin only BCPs, but was bleeding heavily most days.  H/O Bilateral Salpingectomy.  No pelvic pain.  H/O normal Paps.  Last Pap Neg in 11/2018.  Pap reflex today.  Breasts normal.  Mammo Neg 12/2021.  Colono 01/2021. BMI 26.64.  Health labs with Fam MD. - Cytology - PAP( )  2. Encounter for surveillance of contraceptive pills  Well on continuous BCPs with Alaycen 1/35 for menorrhagia.  No BTB.  Will try a lower Estrogen/Progestin pill with the generic of LoEstrin 24 1/20 continuously.  Hypercholesterolemia and cHTN well controled.  S/P  Bilateral Salpingectomy.  Other orders - Norethindrone Acetate-Ethinyl Estrad-FE (LOESTRIN 24 FE) 1-20 MG-MCG(24) tablet; Take 1 tablet by mouth daily. Continuous use for menorrhagia   Princess Bruins MD, 8:50 AM 01/16/2022

## 2022-01-17 LAB — CYTOLOGY - PAP: Diagnosis: NEGATIVE

## 2022-11-20 ENCOUNTER — Other Ambulatory Visit: Payer: Self-pay | Admitting: Obstetrics & Gynecology

## 2022-11-20 DIAGNOSIS — Z1231 Encounter for screening mammogram for malignant neoplasm of breast: Secondary | ICD-10-CM

## 2022-12-22 ENCOUNTER — Ambulatory Visit
Admission: RE | Admit: 2022-12-22 | Discharge: 2022-12-22 | Disposition: A | Discharge status: Home / Self Care | Payer: Managed Care, Other (non HMO) | Source: Ambulatory Visit | Attending: Obstetrics & Gynecology | Admitting: Obstetrics & Gynecology

## 2022-12-22 DIAGNOSIS — Z1231 Encounter for screening mammogram for malignant neoplasm of breast: Secondary | ICD-10-CM

## 2023-01-25 ENCOUNTER — Ambulatory Visit: Payer: Managed Care, Other (non HMO) | Admitting: Radiology

## 2023-02-08 ENCOUNTER — Encounter: Payer: Self-pay | Admitting: Radiology

## 2023-02-08 ENCOUNTER — Ambulatory Visit: Payer: Managed Care, Other (non HMO) | Admitting: Radiology

## 2023-02-08 VITALS — BP 126/74 | Ht 62.0 in | Wt 154.0 lb

## 2023-02-08 DIAGNOSIS — Z3041 Encounter for surveillance of contraceptive pills: Secondary | ICD-10-CM | POA: Diagnosis not present

## 2023-02-08 DIAGNOSIS — Z01419 Encounter for gynecological examination (general) (routine) without abnormal findings: Secondary | ICD-10-CM | POA: Diagnosis not present

## 2023-02-08 MED ORDER — NORETHIN ACE-ETH ESTRAD-FE 1-20 MG-MCG(24) PO TABS: 1.0000 | ORAL_TABLET | Freq: Every day | ORAL | 5 refills | Status: DC

## 2023-02-08 NOTE — Progress Notes (Signed)
Marilyn Gibson 07/02/1971 [id number]   History:  52 y.o. G4P2 presents for annual exam.Up to date on screenings. No new gyn concerns. On OCPs for menorrhagia.  Gynecologic History Patient's last menstrual period was 01/22/2023 (exact date). Period Cycle (Days): 28 Period Duration (Days): 4 Period Pattern: Regular Menstrual Flow: Heavy, Moderate, Light (heavy first day) Menstrual Control: Maxi pad, Thin pad Dysmenorrhea: (!) Mild Dysmenorrhea Symptoms: Cramping Contraception/Family planning: tubal ligation Sexually active: yes Last Pap: 2023. Results were: normal Last mammogram: 6/24. Results were: normal  Obstetric History OB History  Gravida Para Term Preterm AB Living  4 2 2   2 2   SAB IAB Ectopic Multiple Live Births  2            # Outcome Date GA Lbr Len/2nd Weight Sex Type Anes PTL Lv  4 Term           3 Term           2 SAB           1 SAB              The following portions of the patient's history were reviewed and updated as appropriate: allergies, current medications, past family history, past medical history, past social history, past surgical history, and problem list.  Review of Systems Pertinent items noted in HPI and remainder of comprehensive ROS otherwise negative.   Past medical history, past surgical history, family history and social history were all reviewed and documented in the EPIC chart. Past Medical History:  Diagnosis Date   Allergy    Anemia    Anxiety    Asthma    Bronchitis    Cervical dysplasia    as teenager, normal since   Complication of anesthesia    woke up during D&E, was given more anesthesia caused her "loopiness" for 3-5 days after   Endometrial polyp    GERD (gastroesophageal reflux disease)    Grave's disease    dx w/ 1st child 2002 but then after delivery, graves dis went away   Headache(784.0)    tx w lexapro   Heart murmur    as a child, no problem as adult   History of migraines    Hyperlipidemia    no meds    Hypertension    Menorrhagia    Pneumonia    Walking pneumonia   Seasonal allergies    seasonal   Vitamin D deficiency    Wears glasses      Exam:  Vitals:   02/08/23 0817  BP: 126/74  Weight: 154 lb (69.9 kg)  Height: 5\' 2"  (1.575 m)   Body mass index is 28.17 kg/m.  General appearance:  Normal Thyroid:  Symmetrical, normal in size, without palpable masses or nodularity. Respiratory  Auscultation:  Clear without wheezing or rhonchi Cardiovascular  Auscultation:  Regular rate, without rubs, murmurs or gallops  Edema/varicosities:  Not grossly evident Abdominal  Soft,nontender, without masses, guarding or rebound.  Liver/spleen:  No organomegaly noted  Hernia:  None appreciated  Skin  Inspection:  Grossly normal Breasts: Examined lying and sitting.   Right: Without masses, retractions, nipple discharge or axillary adenopathy.   Left: Without masses, retractions, nipple discharge or axillary adenopathy. Genitourinary   Inguinal/mons:  Normal without inguinal adenopathy  External genitalia:  Normal appearing vulva with no masses, tenderness, or lesions  BUS/Urethra/Skene's glands:  Normal without masses or exudate  Vagina:  Normal appearing with normal color and discharge, no lesions  Cervix:  Normal appearing without discharge or lesions  Uterus:  Normal in size, shape and contour.  Mobile, nontender  Adnexa/parametria:     Rt: Normal in size, without masses or tenderness.   Lt: Normal in size, without masses or tenderness.  Anus and perineum: Normal   Raynelle Fanning, CMA present for exam  Assessment/Plan:   1. Well woman exam with routine gynecological exam Pap and screenings up to date  2. Encounter for surveillance of contraceptive pills - Norethindrone Acetate-Ethinyl Estrad-FE (LOESTRIN 24 FE) 1-20 MG-MCG(24) tablet; Take 1 tablet by mouth daily. Continuous use for menorrhagia, skip placebos  Dispense: 84 tablet; Refill: 5     Discussed SBE, colonoscopy  and DEXA screening as directed/appropriate. Recommend of exercise weekly, including weight bearing exercise. Encouraged the use of seatbelts and sunscreen. Return in 1 year for annual or as needed.   Tanda Rockers WHNP-BC 8:39 AM 02/08/2023

## 2023-04-24 IMAGING — MG MM DIGITAL SCREENING BILAT W/ TOMO AND CAD
8 series · 9 of 24 positions shown · non-contrast
Comparison: Previous exam(s).

CLINICAL DATA: Screening.

EXAM:
DIGITAL SCREENING BILATERAL MAMMOGRAM WITH TOMOSYNTHESIS AND CAD
TECHNIQUE: Bilateral screening digital craniocaudal and mediolateral oblique
mammograms were obtained. Bilateral screening digital breast
tomosynthesis was performed. The images were evaluated with
computer-aided detection.

[L CC synth-2D]
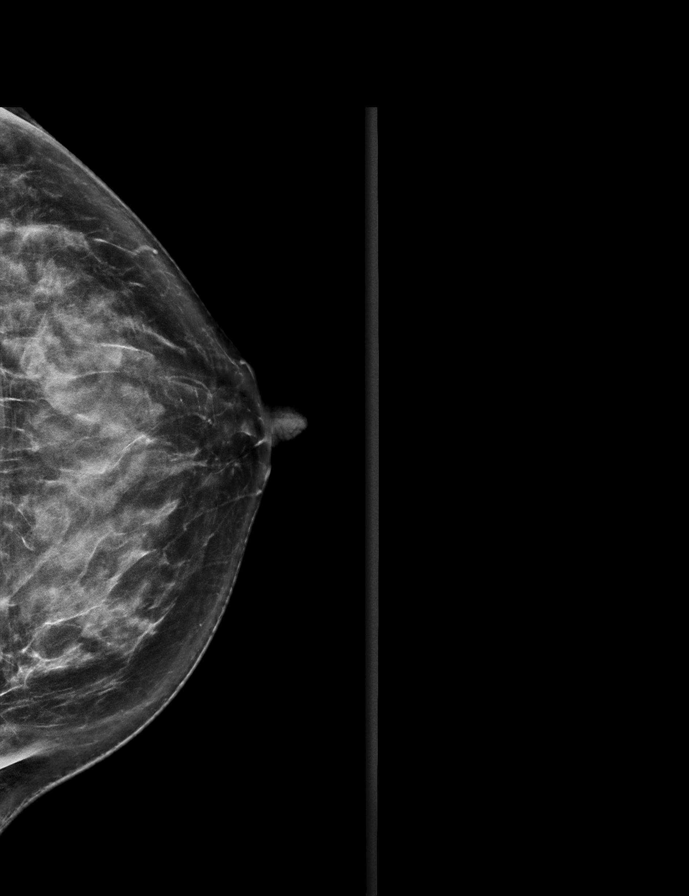

[R CC synth-2D]
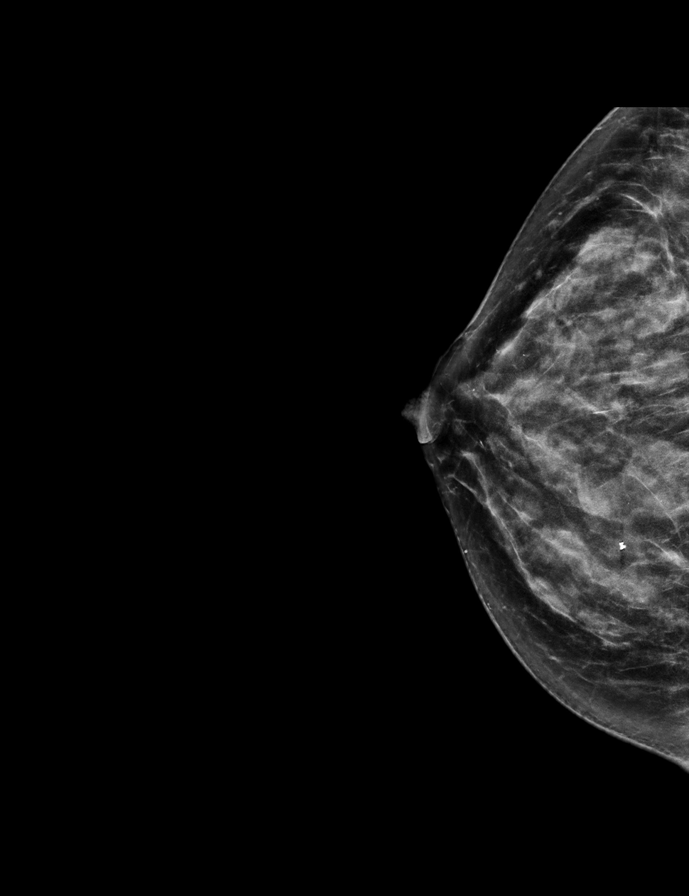

[L MLO synth-2D]
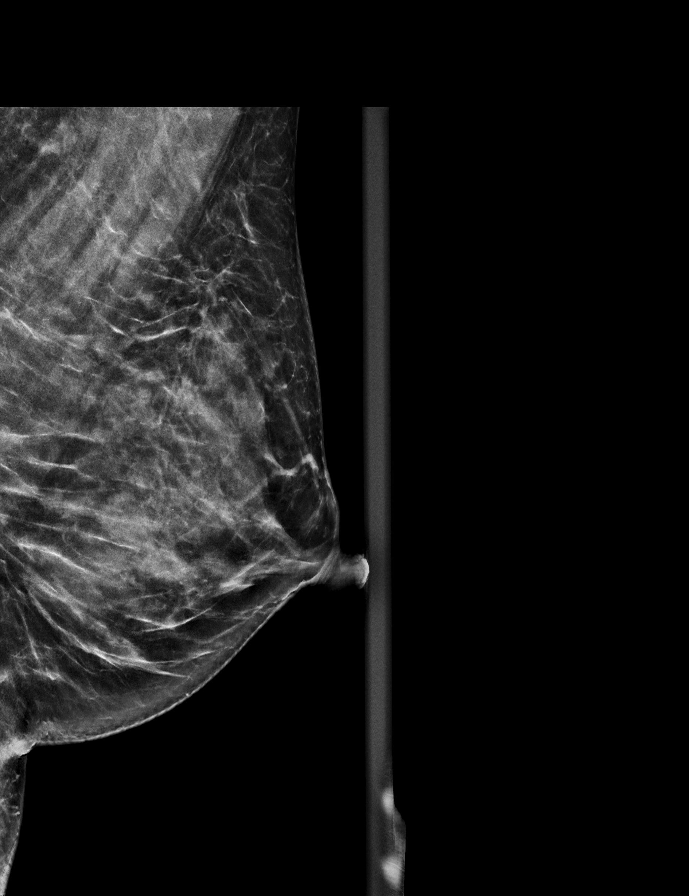

[R MLO synth-2D]
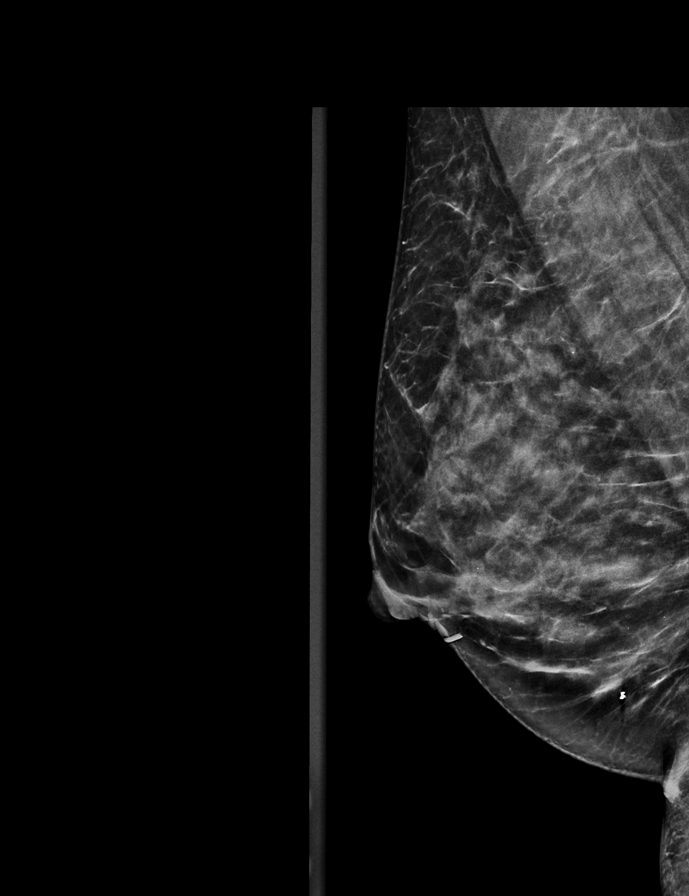

[L MLO tomo · 2 of 62 frames shown]
[frame 21/62]
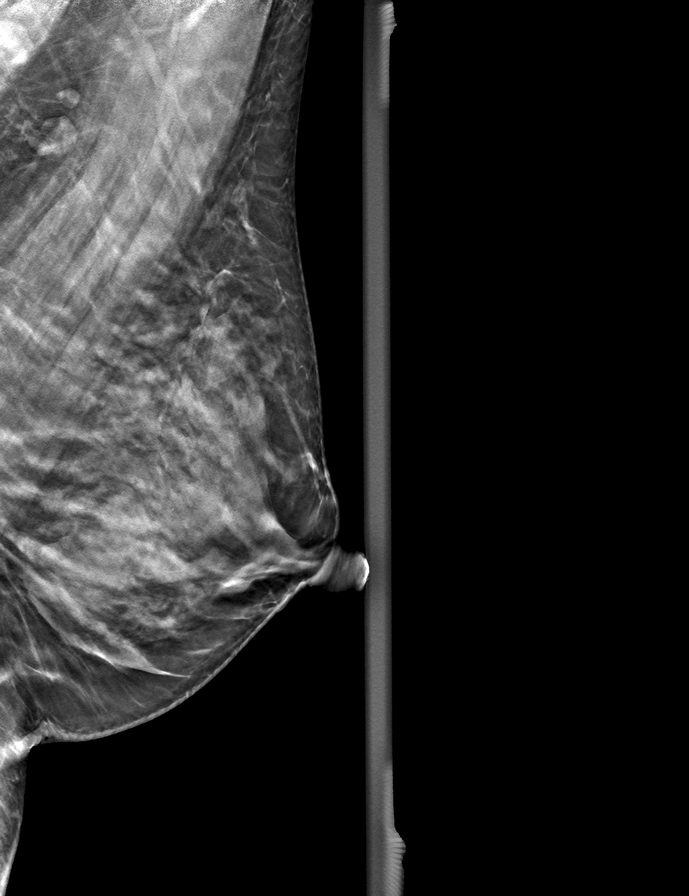
[frame 31/62]
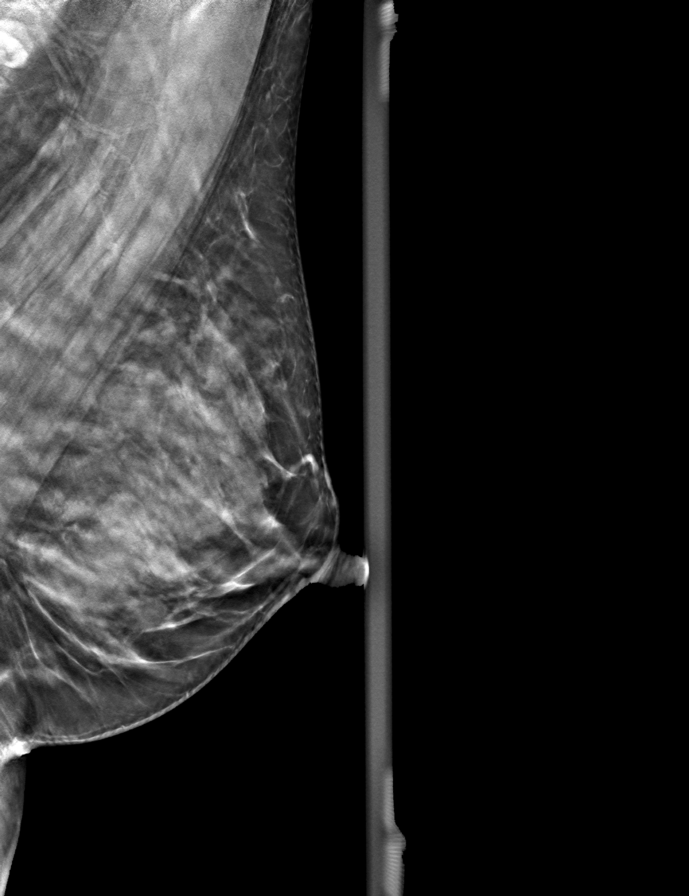

[R CC tomo · tomo slice 30/59.0]
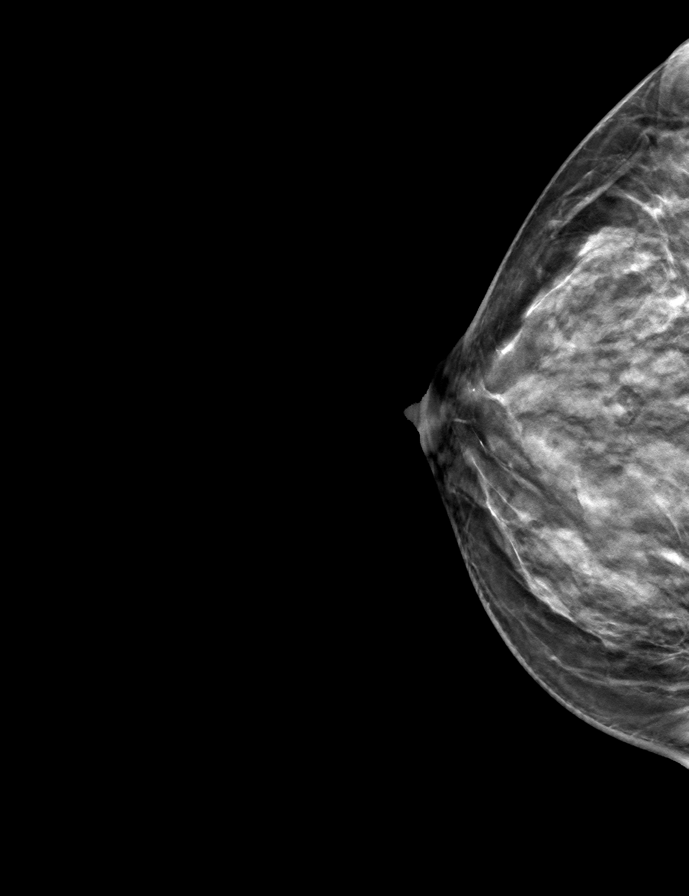

[L CC tomo · tomo slice 33/64.0]
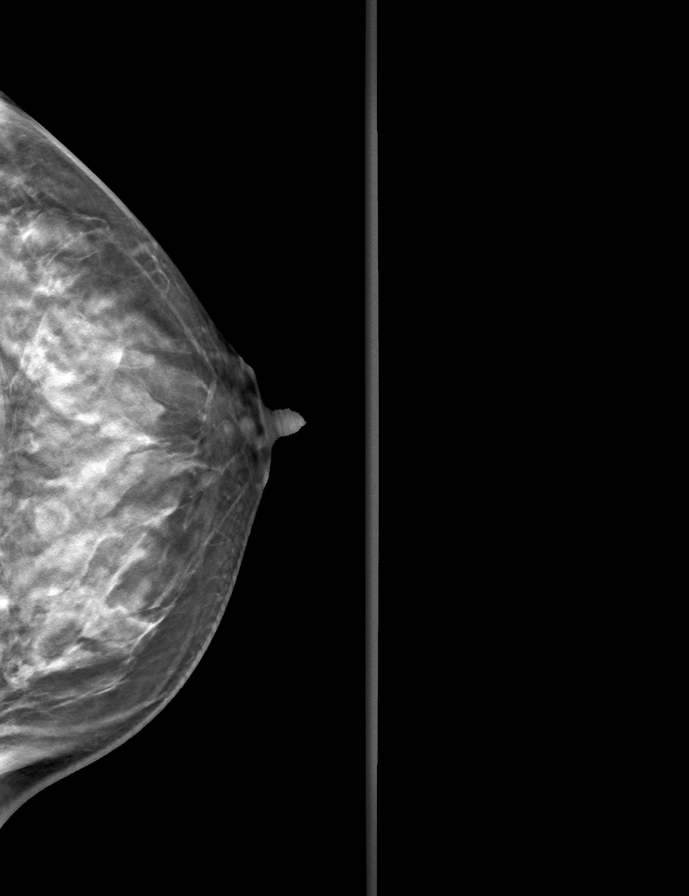

[R MLO tomo · tomo slice 29/58.0]
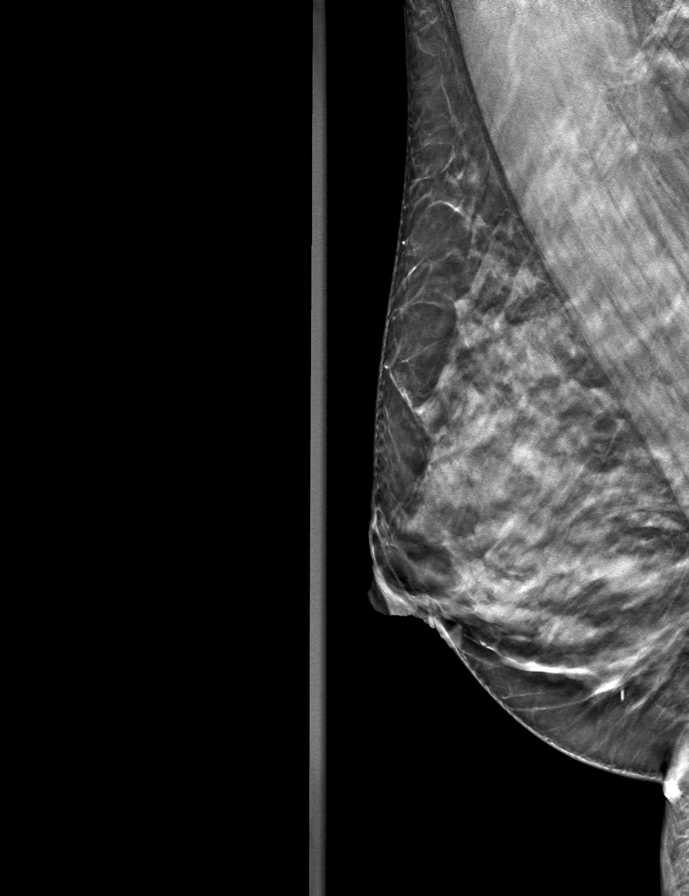

[9 of 24 positions shown; findings below may reference images not displayed]

ACR Breast Density Category c: The breast tissue is heterogeneously
dense, which may obscure small masses.
FINDINGS: There are no findings suspicious for malignancy.
IMPRESSION: No mammographic evidence of malignancy. A result letter of this
screening mammogram will be mailed directly to the patient.

RECOMMENDATION:
Screening mammogram in one year. (Code:[id number]-W-BC3)

BI-RADS CATEGORY  1: Negative.

## 2023-12-04 ENCOUNTER — Ambulatory Visit

## 2023-12-04 ENCOUNTER — Other Ambulatory Visit: Payer: Self-pay | Admitting: Family Medicine

## 2023-12-04 DIAGNOSIS — R058 Other specified cough: Secondary | ICD-10-CM

## 2023-12-06 ENCOUNTER — Ambulatory Visit
Admission: RE | Admit: 2023-12-06 | Discharge: 2023-12-06 | Disposition: A | Discharge status: Home / Self Care | Source: Ambulatory Visit | Attending: Family Medicine | Admitting: Family Medicine

## 2023-12-06 DIAGNOSIS — R058 Other specified cough: Secondary | ICD-10-CM

## 2023-12-10 ENCOUNTER — Other Ambulatory Visit: Payer: Self-pay | Admitting: Family Medicine

## 2023-12-10 DIAGNOSIS — Z1231 Encounter for screening mammogram for malignant neoplasm of breast: Secondary | ICD-10-CM

## 2023-12-28 ENCOUNTER — Ambulatory Visit

## 2024-01-03 ENCOUNTER — Ambulatory Visit
Admission: RE | Admit: 2024-01-03 | Discharge: 2024-01-03 | Disposition: A | Discharge status: Home / Self Care | Source: Ambulatory Visit | Attending: Family Medicine | Admitting: Family Medicine

## 2024-01-03 DIAGNOSIS — Z1231 Encounter for screening mammogram for malignant neoplasm of breast: Secondary | ICD-10-CM

## 2024-01-10 ENCOUNTER — Other Ambulatory Visit: Payer: Self-pay | Admitting: Family Medicine

## 2024-01-10 DIAGNOSIS — R928 Other abnormal and inconclusive findings on diagnostic imaging of breast: Secondary | ICD-10-CM

## 2024-01-18 ENCOUNTER — Ambulatory Visit
Admission: RE | Admit: 2024-01-18 | Discharge: 2024-01-18 | Disposition: A | Discharge status: Home / Self Care | Source: Ambulatory Visit | Attending: Family Medicine | Admitting: Family Medicine

## 2024-01-18 ENCOUNTER — Other Ambulatory Visit: Payer: Self-pay | Admitting: Family Medicine

## 2024-01-18 DIAGNOSIS — R928 Other abnormal and inconclusive findings on diagnostic imaging of breast: Secondary | ICD-10-CM

## 2024-01-18 DIAGNOSIS — N6310 Unspecified lump in the right breast, unspecified quadrant: Secondary | ICD-10-CM

## 2024-01-18 DIAGNOSIS — N6489 Other specified disorders of breast: Secondary | ICD-10-CM

## 2024-02-15 ENCOUNTER — Ambulatory Visit: Admitting: Radiology

## 2024-02-20 ENCOUNTER — Other Ambulatory Visit: Payer: Self-pay | Admitting: Radiology

## 2024-02-20 DIAGNOSIS — Z3041 Encounter for surveillance of contraceptive pills: Secondary | ICD-10-CM

## 2024-02-20 NOTE — Telephone Encounter (Signed)
.  Med refill request: Loestrin  Last AEX: 02/08/23 Next AEX:02/27/24 Last MMG (if hormonal med)01/18/24 BI-Rads category 3 probably benign  Refill authorized: Please Advise?

## 2024-02-27 ENCOUNTER — Ambulatory Visit (INDEPENDENT_AMBULATORY_CARE_PROVIDER_SITE_OTHER): Admitting: Radiology

## 2024-02-27 ENCOUNTER — Encounter: Payer: Self-pay | Admitting: Radiology

## 2024-02-27 VITALS — BP 116/72 | HR 56 | Ht 62.75 in | Wt 172.0 lb

## 2024-02-27 DIAGNOSIS — Z01419 Encounter for gynecological examination (general) (routine) without abnormal findings: Secondary | ICD-10-CM | POA: Diagnosis not present

## 2024-02-27 DIAGNOSIS — Z1331 Encounter for screening for depression: Secondary | ICD-10-CM | POA: Diagnosis not present

## 2024-02-27 DIAGNOSIS — Z3041 Encounter for surveillance of contraceptive pills: Secondary | ICD-10-CM | POA: Diagnosis not present

## 2024-02-27 MED ORDER — NORETHIN ACE-ETH ESTRAD-FE 1-20 MG-MCG(24) PO TABS: 1.0000 | ORAL_TABLET | Freq: Every day | ORAL | 5 refills | Status: AC

## 2024-02-27 NOTE — Patient Instructions (Signed)
 Preventive Care 58-53 Years Old, Female  Preventive care refers to lifestyle choices and visits with your health care provider that can promote health and wellness. Preventive care visits are also called wellness exams.  What can I expect for my preventive care visit?  Counseling  Your health care provider may ask you questions about your:  Medical history, including:  Past medical problems.  Family medical history.  Pregnancy history.  Current health, including:  Menstrual cycle.  Method of birth control.  Emotional well-being.  Home life and relationship well-being.  Sexual activity and sexual health.  Lifestyle, including:  Alcohol, nicotine or tobacco, and drug use.  Access to firearms.  Diet, exercise, and sleep habits.  Work and work Astronomer.  Sunscreen use.  Safety issues such as seatbelt and bike helmet use.  Physical exam  Your health care provider will check your:  Height and weight. These may be used to calculate your BMI (body mass index). BMI is a measurement that tells if you are at a healthy weight.  Waist circumference. This measures the distance around your waistline. This measurement also tells if you are at a healthy weight and may help predict your risk of certain diseases, such as type 2 diabetes and high blood pressure.  Heart rate and blood pressure.  Body temperature.  Skin for abnormal spots.  What immunizations do I need?    Vaccines are usually given at various ages, according to a schedule. Your health care provider will recommend vaccines for you based on your age, medical history, and lifestyle or other factors, such as travel or where you work.  What tests do I need?  Screening  Your health care provider may recommend screening tests for certain conditions. This may include:  Lipid and cholesterol levels.  Diabetes screening. This is done by checking your blood sugar (glucose) after you have not eaten for a while (fasting).  Pelvic exam and Pap test.  Hepatitis B test.  Hepatitis C  test.  HIV (human immunodeficiency virus) test.  STI (sexually transmitted infection) testing, if you are at risk.  Lung cancer screening.  Colorectal cancer screening.  Mammogram. Talk with your health care provider about when you should start having regular mammograms. This may depend on whether you have a family history of breast cancer.  BRCA-related cancer screening. This may be done if you have a family history of breast, ovarian, tubal, or peritoneal cancers.  Bone density scan. This is done to screen for osteoporosis.  Talk with your health care provider about your test results, treatment options, and if necessary, the need for more tests.  Follow these instructions at home:  Eating and drinking    Eat a diet that includes fresh fruits and vegetables, whole grains, lean protein, and low-fat dairy products.  Take vitamin and mineral supplements as recommended by your health care provider.  Do not drink alcohol if:  Your health care provider tells you not to drink.  You are pregnant, may be pregnant, or are planning to become pregnant.  If you drink alcohol:  Limit how much you have to 0-1 drink a day.  Know how much alcohol is in your drink. In the [place] one drink equals one 12 oz bottle of beer (355 mL), one 5 oz glass of wine (148 mL), or one 1 oz glass of hard liquor (44 mL).  Lifestyle  Brush your teeth every morning and night with fluoride toothpaste. Floss one time each day.  Exercise for at least  30 minutes 5 or more days each week.  Do not use any products that contain nicotine or tobacco. These products include cigarettes, chewing tobacco, and vaping devices, such as e-cigarettes. If you need help quitting, ask your health care provider.  Do not use drugs.  If you are sexually active, practice safe sex. Use a condom or other form of protection to prevent STIs.  If you do not wish to become pregnant, use a form of birth control. If you plan to become pregnant, see your health care provider for a  prepregnancy visit.  Take aspirin only as told by your health care provider. Make sure that you understand how much to take and what form to take. Work with your health care provider to find out whether it is safe and beneficial for you to take aspirin daily.  Find healthy ways to manage stress, such as:  Meditation, yoga, or listening to music.  Journaling.  Talking to a trusted person.  Spending time with friends and family.  Minimize exposure to UV radiation to reduce your risk of skin cancer.  Safety  Always wear your seat belt while driving or riding in a vehicle.  Do not drive:  If you have been drinking alcohol. Do not ride with someone who has been drinking.  When you are tired or distracted.  While texting.  If you have been using any mind-altering substances or drugs.  Wear a helmet and other protective equipment during sports activities.  If you have firearms in your house, make sure you follow all gun safety procedures.  Seek help if you have been physically or sexually abused.  What's next?  Visit your health care provider once a year for an annual wellness visit.  Ask your health care provider how often you should have your eyes and teeth checked.  Stay up to date on all vaccines.  This information is not intended to replace advice given to you by your health care provider. Make sure you discuss any questions you have with your health care provider.  Document Revised: 12/15/2020 Document Reviewed: 12/15/2020  Elsevier Patient Education  2024 [employer].

## 2024-02-27 NOTE — Progress Notes (Signed)
 Marilyn Gibson 01/14/1971 [id number]   History:  53 y.o. G4P2 presents for annual exam. Doing well on OCPs for menometrorrhagia, would like to continue, Has occasional spot bleeding at the end of her pack.  Gynecologic History No LMP recorded. (Menstrual status: Oral contraceptives). Period Cycle (Days):  (amenorrhea with COC) Contraception/Family planning: OCP (estrogen/progesterone) and tubal ligation Sexually active: yes Last Pap: 2023. Results were: normal Last mammogram: 01/18/24. Results were: normal Colonoscopy: 2022  Obstetric History OB History  Gravida Para Term Preterm AB Living  4 2 2  2 2   SAB IAB Ectopic Multiple Live Births  2        # Outcome Date GA Lbr Len/2nd Weight Sex Type Anes PTL Lv  4 Term           3 Term           2 SAB           1 SAB                02/27/2024    1:33 PM  Depression screen PHQ 2/9  Decreased Interest 0  Down, Depressed, Hopeless 0  PHQ - 2 Score 0     The following portions of the patient's history were reviewed and updated as appropriate: allergies, current medications, past family history, past medical history, past social history, past surgical history, and problem list.  Review of Systems  All other systems reviewed and are negative.   Past medical history, past surgical history, family history and social history were all reviewed and documented in the EPIC chart.  Exam:  Vitals:   02/27/24 1332  BP: 116/72  Pulse: (!) 56  SpO2: 97%  Weight: 172 lb (78 kg)  Height: 5' 2.75 (1.594 m)   Body mass index is 30.71 kg/m.  Physical Exam Vitals and nursing note reviewed. Exam conducted with a chaperone present.  Constitutional:      Appearance: Normal appearance. She is normal weight.  HENT:     Head: Normocephalic and atraumatic.  Neck:     Thyroid: No thyroid mass, thyromegaly or thyroid tenderness.  Cardiovascular:     Rate and Rhythm: Regular rhythm.     Heart sounds: Normal heart sounds.  Pulmonary:      Effort: Pulmonary effort is normal.     Breath sounds: Normal breath sounds.  Chest:  Breasts:    Breasts are symmetrical.     Right: Normal. No inverted nipple, mass, nipple discharge, skin change or tenderness.     Left: Normal. No inverted nipple, mass, nipple discharge, skin change or tenderness.  Abdominal:     General: Abdomen is flat. Bowel sounds are normal.     Palpations: Abdomen is soft.  Genitourinary:    General: Normal vulva.     Vagina: Normal. No vaginal discharge, bleeding or lesions.     Cervix: Normal. No discharge or lesion.     Uterus: Normal. Not enlarged and not tender.      Adnexa: Right adnexa normal and left adnexa normal.       Right: No mass, tenderness or fullness.         Left: No mass, tenderness or fullness.    Lymphadenopathy:     Upper Body:     Right upper body: No axillary adenopathy.     Left upper body: No axillary adenopathy.  Skin:    General: Skin is warm and dry.  Neurological:     Mental Status: She is alert  and oriented to person, place, and time.  Psychiatric:        Mood and Affect: Mood normal.        Thought Content: Thought content normal.        Judgment: Judgment normal.      Darice Hoit, CMA present for exam  Assessment/Plan:   1. Well woman exam with routine gynecological exam (Primary) Pap 2026  2. Encounter for surveillance of contraceptive pills - Norethindrone  Acetate-Ethinyl Estrad-FE (LOESTRIN 24 FE) 1-20 MG-MCG(24) tablet; Take 1 tablet by mouth daily. Continuous use for menorrhagia, skip placebos  Dispense: 84 tablet; Refill: 5  3. Depression screen     Return in about 1 year (around 02/26/2025) for Annual.  GINETTE COZIER B WHNP-BC 1:52 PM 02/27/2024

## 2024-07-22 ENCOUNTER — Ambulatory Visit
Admission: RE | Admit: 2024-07-22 | Discharge: 2024-07-22 | Disposition: A | Source: Ambulatory Visit | Attending: Family Medicine | Admitting: Family Medicine

## 2024-07-22 ENCOUNTER — Other Ambulatory Visit: Payer: Self-pay | Admitting: Family Medicine

## 2024-07-22 DIAGNOSIS — N6310 Unspecified lump in the right breast, unspecified quadrant: Secondary | ICD-10-CM

## 2024-07-22 DIAGNOSIS — R928 Other abnormal and inconclusive findings on diagnostic imaging of breast: Secondary | ICD-10-CM

## 2024-07-22 DIAGNOSIS — N6489 Other specified disorders of breast: Secondary | ICD-10-CM

## 2025-01-30 ENCOUNTER — Other Ambulatory Visit

## 2025-01-30 ENCOUNTER — Encounter
